# Patient Record
Sex: Male | Born: 1990 | Race: White | Hispanic: No | Marital: Married | Smoking: Current every day smoker
Health system: Southern US, Community
[De-identification: ages and names within clinical notes are randomized; demographics above are authoritative.]

## PROBLEM LIST (undated history)

## (undated) DIAGNOSIS — I480 Paroxysmal atrial fibrillation: Secondary | ICD-10-CM

## (undated) DIAGNOSIS — G4730 Sleep apnea, unspecified: Secondary | ICD-10-CM

## (undated) HISTORY — DX: Paroxysmal atrial fibrillation: I48.0

## (undated) HISTORY — DX: Sleep apnea, unspecified: G47.30

---
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
## 2011-02-18 ENCOUNTER — Emergency Department: Payer: Self-pay | Admitting: Unknown Physician Specialty

## 2011-06-08 ENCOUNTER — Inpatient Hospital Stay: Payer: Self-pay | Admitting: Internal Medicine

## 2011-07-16 ENCOUNTER — Emergency Department: Payer: Self-pay | Admitting: Emergency Medicine

## 2011-07-17 ENCOUNTER — Emergency Department: Payer: Self-pay | Admitting: Emergency Medicine

## 2012-07-16 ENCOUNTER — Emergency Department: Payer: Self-pay | Admitting: Internal Medicine

## 2013-04-11 ENCOUNTER — Emergency Department: Payer: Self-pay | Admitting: Emergency Medicine

## 2014-01-16 ENCOUNTER — Emergency Department: Payer: Self-pay | Admitting: Emergency Medicine

## 2014-01-16 LAB — CBC
HCT: 44 % (ref 40.0–52.0)
HGB: 15.1 g/dL (ref 13.0–18.0)
MCH: 30 pg (ref 26.0–34.0)
MCHC: 34.2 g/dL (ref 32.0–36.0)
MCV: 88 fL (ref 80–100)
Platelet: 149 10*3/uL — ABNORMAL LOW (ref 150–440)
RBC: 5.03 10*6/uL (ref 4.40–5.90)
RDW: 13.4 % (ref 11.5–14.5)
WBC: 6.7 10*3/uL (ref 3.8–10.6)

## 2014-01-16 LAB — COMPREHENSIVE METABOLIC PANEL
ALK PHOS: 66 U/L
ANION GAP: 1 — AB (ref 7–16)
AST: 20 U/L (ref 15–37)
Albumin: 4 g/dL (ref 3.4–5.0)
BUN: 12 mg/dL (ref 7–18)
Bilirubin,Total: 0.4 mg/dL (ref 0.2–1.0)
CHLORIDE: 104 mmol/L (ref 98–107)
CREATININE: 0.84 mg/dL (ref 0.60–1.30)
Calcium, Total: 9.1 mg/dL (ref 8.5–10.1)
Co2: 31 mmol/L (ref 21–32)
EGFR (African American): >60
EGFR (Non-African Amer.): >60
Glucose: 94 mg/dL (ref 65–99)
OSMOLALITY: 271 (ref 275–301)
POTASSIUM: 4.1 mmol/L (ref 3.5–5.1)
SGPT (ALT): 25 U/L (ref 12–78)
Sodium: 136 mmol/L (ref 136–145)
Total Protein: 7.6 g/dL (ref 6.4–8.2)

## 2014-01-16 LAB — URINALYSIS, COMPLETE
BILIRUBIN, UR: NEGATIVE
Bacteria: NONE SEEN
Blood: NEGATIVE
Glucose,UR: NEGATIVE mg/dL (ref 0–75)
KETONE: NEGATIVE
Leukocyte Esterase: NEGATIVE
NITRITE: NEGATIVE
Ph: 6 (ref 4.5–8.0)
Protein: NEGATIVE
RBC,UR: NONE SEEN /HPF (ref 0–5)
SPECIFIC GRAVITY: 1.014 (ref 1.003–1.030)
Squamous Epithelial: NONE SEEN
WBC UR: <1 /HPF (ref 0–5)

## 2014-01-16 LAB — LIPASE, BLOOD: LIPASE: 108 U/L (ref 73–393)

## 2014-05-11 ENCOUNTER — Encounter (INDEPENDENT_AMBULATORY_CARE_PROVIDER_SITE_OTHER): Payer: Self-pay

## 2014-05-11 ENCOUNTER — Ambulatory Visit (INDEPENDENT_AMBULATORY_CARE_PROVIDER_SITE_OTHER): Payer: 59 | Admitting: Cardiovascular Disease

## 2014-05-11 ENCOUNTER — Encounter: Payer: Self-pay | Admitting: Cardiovascular Disease

## 2014-05-11 VITALS — BP 127/74 | HR 72 | Ht 67.0 in | Wt 149.2 lb

## 2014-05-11 DIAGNOSIS — I499 Cardiac arrhythmia, unspecified: Secondary | ICD-10-CM

## 2014-05-11 DIAGNOSIS — I4891 Unspecified atrial fibrillation: Secondary | ICD-10-CM

## 2014-05-11 DIAGNOSIS — I480 Paroxysmal atrial fibrillation: Secondary | ICD-10-CM

## 2014-05-11 NOTE — Assessment & Plan Note (Signed)
The patient had one documented episode of atrial fibrillation in 2012. No further episodes since then. Thus, I recommend watchful waiting. No treatment is indicated at the present time. I strongly advised him to decrease caffeine intake.

## 2014-05-11 NOTE — Progress Notes (Signed)
   HPI  This is a 23 year old male who is self-referred to establish cardiovascular care. He was hospitalized in 2012 at [facility] for atrial fibrillation with rapid ventricular response. Echocardiogram was unremarkable. He took the medication for a few months after that. He reports no further episodes of atrial fibrillation or tachycardia since that time. He did have some palpitations last year but they were brief. He consumes excessive amount of caffeine in the form of 6-7 bottles of [facility] everyday. He denies excessive alcohol use. He denies any recreational drug use. No chest pain no shortness of breath. He does not smoke. There is family history of coronary artery disease but not prematurely. There is no family history of atrial fibrillation or sudden death.  No Known Allergies   No current outpatient prescriptions on file prior to visit.   No current facility-administered medications on file prior to visit.     Past Medical History  Diagnosis Date  . Sleep apnea   . Paroxysmal atrial fibrillation      History reviewed. No pertinent past surgical history.   Family History  Problem Relation Age of Onset  . Family history unknown: Yes     History   Social History  . Marital Status: Single    Spouse Name: N/A    Number of Children: N/A  . Years of Education: N/A   Occupational History  . Not on file.   Social History Main Topics  . Smoking status: Former Smoker -- 2 years    Types: Cigarettes  . Smokeless tobacco: Not on file  . Alcohol Use: No  . Drug Use: No  . Sexual Activity: Not on file   Other Topics Concern  . Not on file   Social History Narrative  . No narrative on file     ROS A 10 point review of system was performed. It is negative other than that mentioned in the history of present illness.   PHYSICAL EXAM   BP 127/74  Pulse 72  Ht 5\' 7"  (1.702 m)  Wt 149 lb 4 oz (67.699 kg)  BMI 23.37 kg/m2 Constitutional: He is oriented to person,  place, and time. He appears well-developed and well-nourished. No distress.  HENT: No nasal discharge.  Head: Normocephalic and atraumatic.  Eyes: Pupils are equal and round.  No discharge. Neck: Normal range of motion. Neck supple. No JVD present. No thyromegaly present.  Cardiovascular: Normal rate, regular rhythm, normal heart sounds. Exam reveals no gallop and no friction rub. No murmur heard.  Pulmonary/Chest: Effort normal and breath sounds normal. No stridor. No respiratory distress. He has no wheezes. He has no rales. He exhibits no tenderness.  Abdominal: Soft. Bowel sounds are normal. He exhibits no distension. There is no tenderness. There is no rebound and no guarding.  Musculoskeletal: Normal range of motion. He exhibits no edema and no tenderness.  Neurological: He is alert and oriented to person, place, and time. Coordination normal.  Skin: Skin is warm and dry. No rash noted. He is not diaphoretic. No erythema. No pallor.  Psychiatric: He has a normal mood and affect. His behavior is normal. Judgment and thought content normal.       EKG: Sinus  Rhythm  WITHIN NORMAL LIMITS   ASSESSMENT AND PLAN

## 2014-05-11 NOTE — Patient Instructions (Signed)
Decrease the amount of caffeine   Your physician wants you to follow-up in: 6 months.  You will receive a reminder letter in the mail two months in advance. If you don't receive a letter, please call our office to schedule the follow-up appointment.

## 2014-05-14 ENCOUNTER — Emergency Department: Payer: Self-pay | Admitting: Emergency Medicine

## 2014-05-14 ENCOUNTER — Telehealth: Payer: Self-pay

## 2014-05-14 LAB — CBC
HCT: 45 % (ref 40.0–52.0)
HGB: 15.1 g/dL (ref 13.0–18.0)
MCH: 29.6 pg (ref 26.0–34.0)
MCHC: 33.6 g/dL (ref 32.0–36.0)
MCV: 88 fL (ref 80–100)
Platelet: 167 10*3/uL (ref 150–440)
RBC: 5.12 10*6/uL (ref 4.40–5.90)
RDW: 13.1 % (ref 11.5–14.5)
WBC: 7.7 10*3/uL (ref 3.8–10.6)

## 2014-05-14 LAB — COMPREHENSIVE METABOLIC PANEL
ALK PHOS: 66 U/L
ALT: 21 U/L (ref 12–78)
Albumin: 4.3 g/dL (ref 3.4–5.0)
Anion Gap: 3 — ABNORMAL LOW (ref 7–16)
BUN: 13 mg/dL (ref 7–18)
Bilirubin,Total: 0.2 mg/dL (ref 0.2–1.0)
CHLORIDE: 102 mmol/L (ref 98–107)
Calcium, Total: 9.1 mg/dL (ref 8.5–10.1)
Co2: 34 mmol/L — ABNORMAL HIGH (ref 21–32)
Creatinine: 0.95 mg/dL (ref 0.60–1.30)
EGFR (African American): >60
EGFR (Non-African Amer.): >60
Glucose: 105 mg/dL — ABNORMAL HIGH (ref 65–99)
Osmolality: 278 (ref 275–301)
POTASSIUM: 3.7 mmol/L (ref 3.5–5.1)
SGOT(AST): 14 U/L — ABNORMAL LOW (ref 15–37)
SODIUM: 139 mmol/L (ref 136–145)
Total Protein: 8.2 g/dL (ref 6.4–8.2)

## 2014-05-14 LAB — TROPONIN I: Troponin-I: <0.02 ng/mL

## 2014-05-14 LAB — PRO B NATRIURETIC PEPTIDE: B-TYPE NATIURETIC PEPTID: 18 pg/mL (ref 0–125)

## 2014-05-14 NOTE — Telephone Encounter (Signed)
Patient returned call and stated he was told in the ED that his chest pain was being caused by acid reflux  He does not wish to be seen at this time

## 2014-05-14 NOTE — Telephone Encounter (Signed)
Pt called and states he is having chest pain currently at work.

## 2014-05-14 NOTE — Telephone Encounter (Signed)
LVM to follow up w patient 5/18

## 2014-05-14 NOTE — Telephone Encounter (Signed)
Patient called back and stated EMS came to his work site and did EKG  Patient said EMS told him that "if he wanted more complete answers he would need blood work" I discussed coming in for a visit with the patient  He stated he would [facility]

## 2014-05-14 NOTE — Telephone Encounter (Signed)
Spoke w/ pt.  He reports that he is having intermittent chest pain, dull, then sharp, on the left side of his chest under his breast. Denies any nausea or sweating, though states that he did "almost throw up about 30 mins ago". Describes the pain as 5/10 on pain scale. States that he previously had similar sx "a long time ago", saw Dr. Juliann Paresallwood, and was diagnosed w/ irregular heartbeat. Admits that he had a small amount of caffeine this morning, "a swig of my friend's drink, not even a whole mouthful". Asked pt what HR is, he states that he does not now how to obtain this.  Pt did not count the number, but states that it is irregular and "jumping around". Advised pt to come to the office for EKG, but he states that he cannot get off of work and does not have copay.  Advised pt not to drive if he is having chest pain.  Pt reports that he is currently at work at [employer] in [place], [place]. Advised pt that he can call EMS or 911 and they can come out and perform and EKG on him and seek treatment if appropriate.  He is agreeable to this, states that he will talk to his boss and "make sure that's okay" and will call w/ further questions or concerns.

## 2014-07-15 ENCOUNTER — Emergency Department: Payer: Self-pay | Admitting: Emergency Medicine

## 2014-07-15 LAB — COMPREHENSIVE METABOLIC PANEL
ALK PHOS: 82 U/L
Albumin: 4.1 g/dL (ref 3.4–5.0)
Anion Gap: 3 — ABNORMAL LOW (ref 7–16)
BILIRUBIN TOTAL: 0.3 mg/dL (ref 0.2–1.0)
BUN: 12 mg/dL (ref 7–18)
CHLORIDE: 106 mmol/L (ref 98–107)
Calcium, Total: 9.1 mg/dL (ref 8.5–10.1)
Co2: 32 mmol/L (ref 21–32)
Creatinine: 1.13 mg/dL (ref 0.60–1.30)
EGFR (Non-African Amer.): >60
Glucose: 91 mg/dL (ref 65–99)
Osmolality: 281 (ref 275–301)
Potassium: 4 mmol/L (ref 3.5–5.1)
SGOT(AST): 22 U/L (ref 15–37)
SGPT (ALT): 19 U/L (ref 12–78)
Sodium: 141 mmol/L (ref 136–145)
Total Protein: 8.2 g/dL (ref 6.4–8.2)

## 2014-07-15 LAB — CBC WITH DIFFERENTIAL/PLATELET
Basophil #: 0.1 10*3/uL (ref 0.0–0.1)
Basophil %: 0.6 %
Eosinophil #: 0.2 10*3/uL (ref 0.0–0.7)
Eosinophil %: 2.4 %
HCT: 44.6 % (ref 40.0–52.0)
HGB: 15.1 g/dL (ref 13.0–18.0)
Lymphocyte #: 2 10*3/uL (ref 1.0–3.6)
Lymphocyte %: 21.2 %
MCH: 30.2 pg (ref 26.0–34.0)
MCHC: 33.9 g/dL (ref 32.0–36.0)
MCV: 89 fL (ref 80–100)
Monocyte #: 0.7 x10 3/mm (ref 0.2–1.0)
Monocyte %: 7.9 %
NEUTROS PCT: 67.9 %
Neutrophil #: 6.4 10*3/uL (ref 1.4–6.5)
Platelet: 167 10*3/uL (ref 150–440)
RBC: 5.01 10*6/uL (ref 4.40–5.90)
RDW: 13.5 % (ref 11.5–14.5)
WBC: 9.4 10*3/uL (ref 3.8–10.6)

## 2014-07-15 LAB — URINALYSIS, COMPLETE
BACTERIA: NONE SEEN
BLOOD: NEGATIVE
Bilirubin,UR: NEGATIVE
Glucose,UR: NEGATIVE mg/dL (ref 0–75)
Ketone: NEGATIVE
Leukocyte Esterase: NEGATIVE
NITRITE: NEGATIVE
Ph: 6 (ref 4.5–8.0)
Protein: NEGATIVE
RBC,UR: 2 /HPF (ref 0–5)
SPECIFIC GRAVITY: 1.027 (ref 1.003–1.030)
Squamous Epithelial: NONE SEEN
WBC UR: 1 /HPF (ref 0–5)

## 2016-11-09 ENCOUNTER — Emergency Department: Payer: BLUE CROSS/BLUE SHIELD

## 2016-11-09 ENCOUNTER — Emergency Department
Admission: EM | Admit: 2016-11-09 | Discharge: 2016-11-09 | Disposition: A | Discharge status: Home / Self Care | Payer: BLUE CROSS/BLUE SHIELD | Attending: Emergency Medicine | Admitting: Emergency Medicine

## 2016-11-09 ENCOUNTER — Encounter: Payer: Self-pay | Admitting: Emergency Medicine

## 2016-11-09 DIAGNOSIS — Z87891 Personal history of nicotine dependence: Secondary | ICD-10-CM | POA: Diagnosis not present

## 2016-11-09 DIAGNOSIS — R072 Precordial pain: Secondary | ICD-10-CM | POA: Insufficient documentation

## 2016-11-09 DIAGNOSIS — R079 Chest pain, unspecified: Secondary | ICD-10-CM

## 2016-11-09 LAB — BASIC METABOLIC PANEL
ANION GAP: 7 (ref 5–15)
BUN: 10 mg/dL (ref 6–20)
CALCIUM: 9.5 mg/dL (ref 8.9–10.3)
CO2: 29 mmol/L (ref 22–32)
Chloride: 102 mmol/L (ref 101–111)
Creatinine, Ser: 0.97 mg/dL (ref 0.61–1.24)
GFR calc Af Amer: >60 mL/min (ref >60)
GLUCOSE: 102 mg/dL — AB (ref 65–99)
POTASSIUM: 3.7 mmol/L (ref 3.5–5.1)
Sodium: 138 mmol/L (ref 135–145)

## 2016-11-09 LAB — CBC
HEMATOCRIT: 45.9 % (ref 40.0–52.0)
HEMOGLOBIN: 16 g/dL (ref 13.0–18.0)
MCH: 30.3 pg (ref 26.0–34.0)
MCHC: 34.8 g/dL (ref 32.0–36.0)
MCV: 87.1 fL (ref 80.0–100.0)
Platelets: 186 10*3/uL (ref 150–440)
RBC: 5.28 MIL/uL (ref 4.40–5.90)
RDW: 12.9 % (ref 11.5–14.5)
WBC: 9.1 10*3/uL (ref 3.8–10.6)

## 2016-11-09 LAB — TROPONIN I: Troponin I: <0.03 ng/mL (ref <0.03)

## 2016-11-09 MED ORDER — RANITIDINE HCL 150 MG PO TABS: 150.0000 mg | ORAL_TABLET | Freq: Two times a day (BID) | ORAL | 0 refills | Status: DC

## 2016-11-09 NOTE — ED Triage Notes (Signed)
Pt reports midsternal CP, onset hr PTA, nonradiating accomp by nausea; st hx of same and dx with reflux

## 2016-11-09 NOTE — ED Provider Notes (Signed)
[facility] Emergency Department Provider Note  ____________________________________________   First MD Initiated Contact with Patient 11/09/16 2016     (approximate)  I have reviewed the triage vital signs and the nursing notes.   HISTORY  Chief Complaint Chest Pain   HPI Joe [name] is a 25 y.o. male with a history of atrial fibrillation was presenting to the emergency department today with sudden onset chest pain that started at about 5:00. He said that he was sitting at a table at home when he had a sudden onset of squeezing chest pain that was a 10 out of 10. He denied any radiation. He denied any shortness of breath. He said that he did have worsening of his pain with deep breathing. However, after about an hour his pain completely resolved. He has no shortness of breath at this time. Denies any pain. Denies any nausea or vomiting. York SpanielSaid that he has similar episode about 7 years ago that was diagnosed with reflux. He denies taking any medications at this time. Denies smoking. Denies any history of blood clots in him or his family. He said that he used to be on a medication for his reflux, Zantac but is no longer taking this.   Past Medical History:  Diagnosis Date  . Paroxysmal atrial fibrillation (HCC)   . Sleep apnea     Patient Active Problem List   Diagnosis Date Noted  . Paroxysmal atrial fibrillation (HCC)     History reviewed. No pertinent surgical history.  Prior to Admission medications   Not on File    Allergies Patient has no known allergies.  Family History  Problem Relation Age of Onset  . Family history unknown: Yes    Social History Social History  Substance Use Topics  . Smoking status: Former Smoker    Years: 2.00    Types: Cigarettes  . Smokeless tobacco: Never Used  . Alcohol use No    Review of Systems Constitutional: No fever/chills Eyes: No visual changes. ENT: No sore throat. Cardiovascular: As  above Respiratory: As above Gastrointestinal: No abdominal pain.  No nausea, no vomiting.  No diarrhea.  No constipation. Genitourinary: Negative for dysuria. Musculoskeletal: Negative for back pain. Skin: Negative for rash. Neurological: Negative for headaches, focal weakness or numbness.  10-point ROS otherwise negative.  ____________________________________________   PHYSICAL EXAM:  VITAL SIGNS: ED Triage Vitals  Enc Vitals Group     BP 11/09/16 1904 122/75     Pulse Rate 11/09/16 1904 98     Resp 11/09/16 1904 (!) 22     Temp 11/09/16 1904 97.6 F (36.4 C)     Temp Source 11/09/16 1904 Oral     SpO2 11/09/16 1904 100 %     Weight 11/09/16 1901 135 lb (61.2 kg)     Height 11/09/16 1901 5\' 6"  (1.676 m)     Head Circumference --      Peak Flow --      Pain Score 11/09/16 1902 10     Pain Loc --      Pain Edu? --      Excl. in GC? --     Constitutional: Alert and oriented. Well appearing and in no acute distress. Eyes: Conjunctivae are normal. PERRL. EOMI. Head: Atraumatic. Nose: No congestion/rhinnorhea. Mouth/Throat: Mucous membranes are moist.   Neck: No stridor.   Cardiovascular: Normal rate, regular rhythm. Grossly normal heart sounds.  Pain is not reproducible palpation. Respiratory: Normal respiratory effort.  No retractions.  Lungs CTAB. Respiratory rate of 18 in the room. Gastrointestinal: Soft and nontender. No distention. No CVA tenderness. Musculoskeletal: No lower extremity tenderness nor edema.  No joint effusions. Neurologic:  Normal speech and language. No gross focal neurologic deficits are appreciated. No gait instability. Skin:  Skin is warm, dry and intact. No rash noted. Psychiatric: Mood and affect are normal. Speech and behavior are normal.  ____________________________________________   LABS (all labs ordered are listed, but only abnormal results are displayed)  Labs Reviewed  BASIC METABOLIC PANEL - Abnormal; Notable for the following:         Result Value   Glucose, Bld 102 (*)    All other components within normal limits  CBC  TROPONIN I   ____________________________________________  EKG  ED ECG REPORT I, Arelia LongestSchaevitz,  Halil Rentz M, the attending physician, personally viewed and interpreted this ECG.   Date: 11/09/2016  EKG Time: 1901  Rate: 109  Rhythm: normal sinus rhythm  Axis: Normal  Intervals:none  ST&T Change: No ST segment elevation or depression. No abnormal T-wave inversion.  ____________________________________________  RADIOLOGY  DG Chest 2 View (Final result)  Result time 11/09/16 19:25:24  Final result by Awilda Metroourtnay Bloomer, MD (11/09/16 19:25:24)           Narrative:   CLINICAL DATA: Midsternal chest pain. History of reflux disease.  EXAM: CHEST 2 VIEW  COMPARISON: Chest radiograph May 14, 2014  FINDINGS: Cardiomediastinal silhouette is normal. No pleural effusions or focal consolidations. Trachea projects midline and there is no pneumothorax. Soft tissue planes and included osseous structures are non-suspicious.  IMPRESSION: Normal chest.   Electronically Signed By: Awilda Metroourtnay Bloomer M.D. On: 11/09/2016 19:25          ____________________________________________   PROCEDURES  Procedure(s) performed:   Procedures  Critical Care performed:   ____________________________________________   INITIAL IMPRESSION / ASSESSMENT AND PLAN / ED COURSE  Pertinent labs & imaging results that were available during my care of the patient were reviewed by me and considered in my medical decision making (see chart for details).  Clinical Course    ----------------------------------------- 9:15 PM on 11/09/2016 -----------------------------------------  Patient asymptomatic at this time. Very reassuring workup. Was initially tachycardic but while I was examining him in the room his heart rate was in the 70s and 80s. Major consideration for this visit would be a pulmonary  embolus. However, this is highly unlikely given the spontaneous resolution of the symptoms. The patient also reported that he has been passing a lot of gas and the symptoms have improved gradually over time with this. We discussed further workup and agreed that the patient would be restarted on Zantac and that if he had any return of his symptoms he would return to the emergency department for further workup at this time.  ____________________________________________   FINAL CLINICAL IMPRESSION(S) / ED DIAGNOSES  Chest pain.  NEW MEDICATIONS STARTED DURING THIS VISIT:  New Prescriptions   No medications on file     Note:  This document was prepared using Dragon voice recognition software and may include unintentional dictation errors.    Myrna Blazeravid Matthew Adolph Clutter, MD 11/09/16 2119

## 2016-11-09 NOTE — ED Notes (Signed)
Discharge instructions reviewed with patient. Patient verbalized understanding. Patient ambulated to lobby without difficulty.   

## 2017-04-29 ENCOUNTER — Emergency Department (HOSPITAL_COMMUNITY): Payer: Worker's Compensation | Admitting: Certified Registered Nurse Anesthetist

## 2017-04-29 ENCOUNTER — Ambulatory Visit (HOSPITAL_COMMUNITY)
Admission: EM | Admit: 2017-04-29 | Discharge: 2017-04-29 | Disposition: A | Discharge status: Home / Self Care | Payer: Worker's Compensation | Attending: Emergency Medicine | Admitting: Emergency Medicine

## 2017-04-29 ENCOUNTER — Encounter (HOSPITAL_COMMUNITY): Payer: Self-pay

## 2017-04-29 ENCOUNTER — Encounter (HOSPITAL_COMMUNITY)
Admission: EM | Disposition: A | Discharge status: Home / Self Care | Payer: Self-pay | Source: Home / Self Care | Attending: Emergency Medicine

## 2017-04-29 ENCOUNTER — Emergency Department (HOSPITAL_COMMUNITY): Payer: Worker's Compensation

## 2017-04-29 DIAGNOSIS — Z79899 Other long term (current) drug therapy: Secondary | ICD-10-CM | POA: Diagnosis not present

## 2017-04-29 DIAGNOSIS — M795 Residual foreign body in soft tissue: Secondary | ICD-10-CM | POA: Diagnosis present

## 2017-04-29 DIAGNOSIS — G4730 Sleep apnea, unspecified: Secondary | ICD-10-CM | POA: Insufficient documentation

## 2017-04-29 DIAGNOSIS — I480 Paroxysmal atrial fibrillation: Secondary | ICD-10-CM | POA: Insufficient documentation

## 2017-04-29 DIAGNOSIS — S61442A Puncture wound with foreign body of left hand, initial encounter: Secondary | ICD-10-CM | POA: Insufficient documentation

## 2017-04-29 DIAGNOSIS — Z87891 Personal history of nicotine dependence: Secondary | ICD-10-CM | POA: Insufficient documentation

## 2017-04-29 DIAGNOSIS — X58XXXA Exposure to other specified factors, initial encounter: Secondary | ICD-10-CM | POA: Diagnosis not present

## 2017-04-29 HISTORY — PX: FOREIGN BODY REMOVAL: SHX962

## 2017-04-29 LAB — BASIC METABOLIC PANEL
Anion gap: 5 (ref 5–15)
BUN: 9 mg/dL (ref 6–20)
CO2: 27 mmol/L (ref 22–32)
Calcium: 8.9 mg/dL (ref 8.9–10.3)
Chloride: 106 mmol/L (ref 101–111)
Creatinine, Ser: 0.97 mg/dL (ref 0.61–1.24)
GFR calc Af Amer: >60 mL/min (ref >60)
GFR calc non Af Amer: >60 mL/min (ref >60)
Glucose, Bld: 91 mg/dL (ref 65–99)
Potassium: 3.7 mmol/L (ref 3.5–5.1)
Sodium: 138 mmol/L (ref 135–145)

## 2017-04-29 LAB — CBC WITH DIFFERENTIAL/PLATELET
Basophils Absolute: 0 10*3/uL (ref 0.0–0.1)
Basophils Relative: 0 %
Eosinophils Absolute: 0.1 10*3/uL (ref 0.0–0.7)
Eosinophils Relative: 0 %
HCT: 41.5 % (ref 39.0–52.0)
Hemoglobin: 14.2 g/dL (ref 13.0–17.0)
Lymphocytes Relative: 18 %
Lymphs Abs: 2.2 10*3/uL (ref 0.7–4.0)
MCH: 29.6 pg (ref 26.0–34.0)
MCHC: 34.2 g/dL (ref 30.0–36.0)
MCV: 86.6 fL (ref 78.0–100.0)
Monocytes Absolute: 1 10*3/uL (ref 0.1–1.0)
Monocytes Relative: 8 %
Neutro Abs: 9.1 10*3/uL — ABNORMAL HIGH (ref 1.7–7.7)
Neutrophils Relative %: 74 %
Platelets: 197 10*3/uL (ref 150–400)
RBC: 4.79 MIL/uL (ref 4.22–5.81)
RDW: 12.7 % (ref 11.5–15.5)
WBC: 12.3 10*3/uL — ABNORMAL HIGH (ref 4.0–10.5)

## 2017-04-29 SURGERY — REMOVAL FOREIGN BODY EXTREMITY
Anesthesia: General | Site: Hand | Laterality: Left

## 2017-04-29 MED ORDER — MIDAZOLAM HCL 2 MG/2ML IJ SOLN
INTRAMUSCULAR | Status: DC | PRN
  Administered 2017-04-29: 2 mg via INTRAVENOUS

## 2017-04-29 MED ORDER — MIDAZOLAM HCL 2 MG/2ML IJ SOLN: 0.5000 mg | Freq: Once | INTRAMUSCULAR | Status: DC | PRN

## 2017-04-29 MED ORDER — MORPHINE SULFATE (PF) 4 MG/ML IV SOLN
4.0000 mg | Freq: Once | INTRAVENOUS | Status: AC
  Administered 2017-04-29: 4 mg via INTRAVENOUS
  Filled 2017-04-29: qty 1

## 2017-04-29 MED ORDER — MIDAZOLAM HCL 2 MG/2ML IJ SOLN
INTRAMUSCULAR | Status: AC
  Filled 2017-04-29: qty 2

## 2017-04-29 MED ORDER — HYDROMORPHONE HCL 1 MG/ML IJ SOLN: 0.2500 mg | INTRAMUSCULAR | Status: DC | PRN

## 2017-04-29 MED ORDER — CHLORHEXIDINE GLUCONATE 4 % EX LIQD: 60.0000 mL | Freq: Once | CUTANEOUS | Status: DC

## 2017-04-29 MED ORDER — PROMETHAZINE HCL 25 MG/ML IJ SOLN: 6.2500 mg | INTRAMUSCULAR | Status: DC | PRN

## 2017-04-29 MED ORDER — LACTATED RINGERS IV SOLN
INTRAVENOUS | Status: DC
  Administered 2017-04-29: 18:00:00 via INTRAVENOUS

## 2017-04-29 MED ORDER — FENTANYL CITRATE (PF) 250 MCG/5ML IJ SOLN
INTRAMUSCULAR | Status: AC
  Filled 2017-04-29: qty 5

## 2017-04-29 MED ORDER — LIDOCAINE 2% (20 MG/ML) 5 ML SYRINGE
INTRAMUSCULAR | Status: DC | PRN
Start: 2017-04-29 — End: 2017-04-29
  Administered 2017-04-29: 50 mg via INTRAVENOUS

## 2017-04-29 MED ORDER — ONDANSETRON HCL 4 MG/2ML IJ SOLN
INTRAMUSCULAR | Status: DC | PRN
  Administered 2017-04-29: 4 mg via INTRAVENOUS

## 2017-04-29 MED ORDER — SODIUM CHLORIDE 0.9 % IV BOLUS (SEPSIS)
1000.0000 mL | Freq: Once | INTRAVENOUS | Status: AC
  Administered 2017-04-29: 1000 mL via INTRAVENOUS

## 2017-04-29 MED ORDER — LIDOCAINE 2% (20 MG/ML) 5 ML SYRINGE
INTRAMUSCULAR | Status: AC
  Filled 2017-04-29: qty 5

## 2017-04-29 MED ORDER — 0.9 % SODIUM CHLORIDE (POUR BTL) OPTIME
TOPICAL | Status: DC | PRN
  Administered 2017-04-29: 1000 mL

## 2017-04-29 MED ORDER — SUCCINYLCHOLINE CHLORIDE 200 MG/10ML IV SOSY
PREFILLED_SYRINGE | INTRAVENOUS | Status: AC
  Filled 2017-04-29: qty 10

## 2017-04-29 MED ORDER — CEFAZOLIN SODIUM-DEXTROSE 2-4 GM/100ML-% IV SOLN
2.0000 g | INTRAVENOUS | Status: DC
  Filled 2017-04-29: qty 100

## 2017-04-29 MED ORDER — SUCCINYLCHOLINE CHLORIDE 200 MG/10ML IV SOSY
PREFILLED_SYRINGE | INTRAVENOUS | Status: DC | PRN
  Administered 2017-04-29: 50 mg via INTRAVENOUS

## 2017-04-29 MED ORDER — BUPIVACAINE HCL (PF) 0.25 % IJ SOLN
INTRAMUSCULAR | Status: AC
  Filled 2017-04-29: qty 30

## 2017-04-29 MED ORDER — SULFAMETHOXAZOLE-TRIMETHOPRIM 800-160 MG PO TABS: 1.0000 | ORAL_TABLET | Freq: Two times a day (BID) | ORAL | 0 refills | Status: DC

## 2017-04-29 MED ORDER — BUPIVACAINE HCL (PF) 0.25 % IJ SOLN
INTRAMUSCULAR | Status: DC | PRN
  Administered 2017-04-29: 8 mL

## 2017-04-29 MED ORDER — CEFAZOLIN SODIUM-DEXTROSE 2-3 GM-% IV SOLR
INTRAVENOUS | Status: DC | PRN
  Administered 2017-04-29: 2 g via INTRAVENOUS

## 2017-04-29 MED ORDER — LIDOCAINE HCL 1 % IJ SOLN
INTRAMUSCULAR | Status: AC
  Filled 2017-04-29: qty 20

## 2017-04-29 MED ORDER — FENTANYL CITRATE (PF) 100 MCG/2ML IJ SOLN
INTRAMUSCULAR | Status: DC | PRN
  Administered 2017-04-29: 100 ug via INTRAVENOUS
  Administered 2017-04-29: 150 ug via INTRAVENOUS

## 2017-04-29 MED ORDER — HYDROCODONE-ACETAMINOPHEN 5-325 MG PO TABS: ORAL_TABLET | ORAL | 0 refills | Status: DC

## 2017-04-29 MED ORDER — LACTATED RINGERS IV SOLN
INTRAVENOUS | Status: DC | PRN
  Administered 2017-04-29: 18:00:00 via INTRAVENOUS

## 2017-04-29 MED ORDER — MEPERIDINE HCL 25 MG/ML IJ SOLN: 6.2500 mg | INTRAMUSCULAR | Status: DC | PRN

## 2017-04-29 MED ORDER — POVIDONE-IODINE 10 % EX SWAB: 2.0000 "application " | Freq: Once | CUTANEOUS | Status: DC

## 2017-04-29 MED ORDER — PROPOFOL 10 MG/ML IV BOLUS
INTRAVENOUS | Status: DC | PRN
  Administered 2017-04-29: 40 mg via INTRAVENOUS
  Administered 2017-04-29: 160 mg via INTRAVENOUS

## 2017-04-29 MED ORDER — ONDANSETRON HCL 4 MG/2ML IJ SOLN
INTRAMUSCULAR | Status: AC
  Filled 2017-04-29: qty 2

## 2017-04-29 SURGICAL SUPPLY — 57 items
BAG DECANTER FOR FLEXI CONT (MISCELLANEOUS) IMPLANT
BANDAGE ACE 3X5.8 VEL STRL LF (GAUZE/BANDAGES/DRESSINGS) IMPLANT
BANDAGE ACE 4X5 VEL STRL LF (GAUZE/BANDAGES/DRESSINGS) ×3 IMPLANT
BANDAGE COBAN STERILE 2 (GAUZE/BANDAGES/DRESSINGS) ×3 IMPLANT
BLADE MINI RND TIP GREEN BEAV (BLADE) IMPLANT
BLADE SURG 15 STRL LF DISP TIS (BLADE) IMPLANT
BLADE SURG 15 STRL SS (BLADE)
BNDG ESMARK 4X9 LF (GAUZE/BANDAGES/DRESSINGS) ×3 IMPLANT
BNDG GAUZE ELAST 4 BULKY (GAUZE/BANDAGES/DRESSINGS) ×3 IMPLANT
CHLORAPREP W/TINT 26ML (MISCELLANEOUS) IMPLANT
CORDS BIPOLAR (ELECTRODE) ×3 IMPLANT
COVER BACK TABLE 60X90IN (DRAPES) IMPLANT
COVER MAYO STAND STRL (DRAPES) IMPLANT
CUFF TOURNIQUET SINGLE 18IN (TOURNIQUET CUFF) ×3 IMPLANT
DECANTER SPIKE VIAL GLASS SM (MISCELLANEOUS) IMPLANT
DRAPE HALF SHEET 40X57 (DRAPES) IMPLANT
DRAPE SURG 17X23 STRL (DRAPES) ×3 IMPLANT
GAUZE SPONGE 4X4 12PLY STRL (GAUZE/BANDAGES/DRESSINGS) ×3 IMPLANT
GAUZE XEROFORM 1X8 LF (GAUZE/BANDAGES/DRESSINGS) ×3 IMPLANT
GLOVE BIO SURGEON STRL SZ7.5 (GLOVE) ×3 IMPLANT
GLOVE BIOGEL PI IND STRL 8 (GLOVE) ×1 IMPLANT
GLOVE BIOGEL PI INDICATOR 8 (GLOVE) ×2
GOWN STRL REUS W/ TWL LRG LVL3 (GOWN DISPOSABLE) ×1 IMPLANT
GOWN STRL REUS W/ TWL XL LVL3 (GOWN DISPOSABLE) ×1 IMPLANT
GOWN STRL REUS W/TWL LRG LVL3 (GOWN DISPOSABLE) ×2
GOWN STRL REUS W/TWL XL LVL3 (GOWN DISPOSABLE) ×2
KIT BASIN OR (CUSTOM PROCEDURE TRAY) ×3 IMPLANT
LOOP VESSEL MAXI BLUE (MISCELLANEOUS) ×3 IMPLANT
NDL SAFETY ECLIPSE 18X1.5 (NEEDLE) IMPLANT
NEEDLE HYPO 18GX1.5 SHARP (NEEDLE)
NEEDLE HYPO 25X1 1.5 SAFETY (NEEDLE) ×3 IMPLANT
NS IRRIG 1000ML POUR BTL (IV SOLUTION) ×3 IMPLANT
PAD CAST 3X4 CTTN HI CHSV (CAST SUPPLIES) IMPLANT
PAD CAST 4YDX4 CTTN HI CHSV (CAST SUPPLIES) IMPLANT
PADDING CAST ABS 4INX4YD NS (CAST SUPPLIES) ×2
PADDING CAST ABS COTTON 4X4 ST (CAST SUPPLIES) ×1 IMPLANT
PADDING CAST COTTON 3X4 STRL (CAST SUPPLIES)
PADDING CAST COTTON 4X4 STRL (CAST SUPPLIES)
SCRUB BETADINE 4OZ XXX (MISCELLANEOUS) ×3 IMPLANT
SOLUTION BETADINE 4OZ (MISCELLANEOUS) ×3 IMPLANT
SPEAR EYE SURG WECK-CEL (MISCELLANEOUS) IMPLANT
SPLINT PLASTER CAST XFAST 3X15 (CAST SUPPLIES) IMPLANT
SPLINT PLASTER XTRA FASTSET 3X (CAST SUPPLIES)
STOCKINETTE 4X48 STRL (DRAPES) IMPLANT
SUT ETHIBOND 3-0 V-5 (SUTURE) IMPLANT
SUT ETHILON 4 0 PS 2 18 (SUTURE) ×3 IMPLANT
SUT FIBERWIRE 4-0 18 TAPR NDL (SUTURE)
SUT MERSILENE 6 0 P 1 (SUTURE) IMPLANT
SUT NYLON 9 0 VRM6 (SUTURE) IMPLANT
SUT PROLENE 6 0 P 1 18 (SUTURE) IMPLANT
SUT SILK 4 0 PS 2 (SUTURE) IMPLANT
SUT VICRYL 4-0 PS2 18IN ABS (SUTURE) IMPLANT
SUTURE FIBERWR 4-0 18 TAPR NDL (SUTURE) IMPLANT
SYR BULB 3OZ (MISCELLANEOUS) IMPLANT
SYR CONTROL 10ML LL (SYRINGE) ×3 IMPLANT
TOWEL OR 17X24 6PK STRL BLUE (TOWEL DISPOSABLE) ×6 IMPLANT
UNDERPAD 30X30 (UNDERPADS AND DIAPERS) ×3 IMPLANT

## 2017-04-29 NOTE — ED Notes (Signed)
Waiting for hand surgery, pt updated

## 2017-04-29 NOTE — Brief Op Note (Signed)
04/29/2017  7:05 PM  PATIENT:  Joe [name]  26 y.o. male  PRE-OPERATIVE DIAGNOSIS:  left hand foreign body  POST-OPERATIVE DIAGNOSIS:  left hand foreign body  PROCEDURE:  Procedure(s): REMOVAL FOREIGN BODY EXTREMITY and wound exploration (Left)  SURGEON:  Surgeon(s) and Role:    * Betha LoaKevin Keondre Markson, MD - Primary  PHYSICIAN ASSISTANT:   ASSISTANTS: none   ANESTHESIA:   general  EBL:  No intake/output data recorded.  BLOOD ADMINISTERED:none  DRAINS: vessel loop drain  LOCAL MEDICATIONS USED:  MARCAINE     SPECIMEN:  No Specimen  DISPOSITION OF SPECIMEN:  N/A  COUNTS:  YES  TOURNIQUET:   Total Tourniquet Time Documented: Upper Arm (Left) - 21 minutes Total: Upper Arm (Left) - 21 minutes   DICTATION: .Other Dictation: Dictation Number ([id number]) [id number]  PLAN OF CARE: Discharge to home after PACU  PATIENT DISPOSITION:  PACU - hemodynamically stable.

## 2017-04-29 NOTE — Op Note (Signed)
[id number] 

## 2017-04-29 NOTE — Transfer of Care (Signed)
Immediate Anesthesia Transfer of Care Note  Patient: Joe [name]  Procedure(s) Performed: Procedure(s): REMOVAL FOREIGN BODY EXTREMITY and wound exploration (Left)  Patient Location: PACU  Anesthesia Type:General  Level of Consciousness: sedated  Airway & Oxygen Therapy: Patient Spontanous Breathing and Patient connected to nasal cannula oxygen  Post-op Assessment: Report given to RN and Post -op Vital signs reviewed and stable  Post vital signs: Reviewed and stable  Last Vitals:  Vitals:   04/29/17 1645 04/29/17 1700  BP: 115/71 121/74  Pulse: 81 98  Resp:    Temp:      Last Pain:  Vitals:   04/29/17 1726  TempSrc:   PainSc: 3          Complications: No apparent anesthesia complications

## 2017-04-29 NOTE — Progress Notes (Signed)
Patient very anxious after surgery in PACU.  Comforting measures and reassurance offered at bedside.  Patient more calm and relaxed.  Will continue to monitor.

## 2017-04-29 NOTE — ED Provider Notes (Signed)
[facility]-EMERGENCY DEPT Provider Note   CSN: [id number] Arrival date & time: 04/29/17  1342  By signing my name below, I, Rosana Fret, attest that this documentation has been prepared under the direction and in the presence of [employer], PA-C.  Electronically Signed: Rosana Fret, ED Scribe. 04/29/17. 2:31 PM.  History   Chief Complaint Chief Complaint  Patient presents with  . Foreign Body in Skin   The history is provided by the patient. No language interpreter was used.    HPI Comments: Joe [name] is a 26 y.o. male who presents to the Emergency Department complaining of sensation of foreign body to the palmar left hand which began prior to arrival. Per pt, he works in a lumbar yard stacking piles of wood, and today while stacking a pile a small piece of wood was lodged into the palmar surface of his left hand. Pt was previously at urgent care prior to coming into the ED where they numbed the area and attempted to remove the wood. This extraction event was unsuccessful. He notes that with manipulation of the piece of wood that his pain is present more toward the dorsum of his hand. His pain is also exacerbated with this action. He denies numbness, weakness, or any other associated symptoms.  Tetanus was updated at urgent care today.  NPO is 0500.   Past Medical History:  Diagnosis Date  . Paroxysmal atrial fibrillation (HCC)   . Sleep apnea    Patient Active Problem List   Diagnosis Date Noted  . Paroxysmal atrial fibrillation (HCC)    History reviewed. No pertinent surgical history.  Home Medications    Prior to Admission medications   Medication Sig Start Date End Date Taking? Authorizing Provider  ranitidine (ZANTAC) 150 MG tablet Take 1 tablet (150 mg total) by mouth 2 (two) times daily. 11/09/16 11/09/17  Myrna Blazer, MD   Family History Family History  Problem Relation Age of Onset  . Family history unknown: Yes   Social History Social  History  Substance Use Topics  . Smoking status: Former Smoker    Years: 2.00    Types: Cigarettes  . Smokeless tobacco: Never Used  . Alcohol use No   Allergies   Patient has no known allergies.  Review of Systems Review of Systems  Musculoskeletal: Positive for myalgias.  Skin: Positive for wound.  Neurological: Negative for weakness and numbness.  All other systems reviewed and are negative.  Physical Exam Updated Vital Signs BP 120/82 (BP Location: Right Arm)   Pulse 81   Temp 98.6 F (37 C) (Oral)   Resp 17   Ht 5\' 6"  (1.676 m)   Wt 71.2 kg   SpO2 100%   BMI 25.34 kg/m   Physical Exam  Constitutional: He appears well-developed and well-nourished. No distress.  HENT:  Head: Normocephalic and atraumatic.  Eyes: Conjunctivae are normal.  Neck: Normal range of motion.  Cardiovascular: Normal rate.   Pulmonary/Chest: Effort normal.  Abdominal: He exhibits no distension.  Musculoskeletal: He exhibits tenderness.  TTP of the dorsum of the hand b/t the second and third MCPs. See image.   Unable to flex MCP 3rd digit  Neurological: He is alert.  Skin: No pallor.  Psychiatric: He has a normal mood and affect. His behavior is normal.  Nursing note and vitals reviewed.    ED Treatments / Results   DIAGNOSTIC STUDIES: Oxygen Saturation is 100% on RA, normal by my interpretation.   COORDINATION OF CARE: 2:25  PM-Discussed next steps with pt. Pt verbalized understanding and is agreeable with the plan.   Labs (all labs ordered are listed, but only abnormal results are displayed) Labs Reviewed - No data to display  EKG  EKG Interpretation None      Radiology Dg Hand Complete Left  Result Date: 04/29/2017 CLINICAL DATA:  Pt got a shard of wood stuck in left hand today while stacking wood - wood is sticking out of the anterior hand just below the webbing between the 3rd and 4th fingers - pt unwilling to outstretch hand for images due to "pain" EXAM: LEFT HAND  - COMPLETE 3+ VIEW COMPARISON:  None. FINDINGS: No fracture.  No bone lesion. Joints are normally spaced and aligned.  No arthropathic change. The wooden foreign body is not resolved radiographically. There are no radiopaque foreign bodies. IMPRESSION: 1. No fracture, bone lesion or joint abnormality. 2. No foreign body visualized. Electronically Signed   By: Amie Portlandavid  Ormond M.D.   On: 04/29/2017 15:18    Procedures Procedures   Medications Ordered in ED Medications  morphine 4 MG/ML injection 4 mg (4 mg Intravenous Given 04/29/17 1448)  sodium chloride 0.9 % bolus 1,000 mL (1,000 mLs Intravenous New Bag/Given 04/29/17 1556)    Initial Impression / Assessment and Plan / ED Course  I have reviewed the triage vital signs and the nursing notes.  Pertinent labs & imaging results that were available during my care of the patient were reviewed by me and considered in my medical decision making (see chart for details).      Final Clinical Impressions(s) / ED Diagnoses   Final diagnoses:  Foreign body (FB) in soft tissue   New Prescriptions New Prescriptions   No medications on file     Imaging: DG hand complete   Therapeutics: Morphine, normal saline  Discharge Meds:   Assessment/Plan:   26 year old male presents today with embedded foreign object.  Patient has a small piece of wood in the dorsum of his hand.  He was seen at urgent care and had local anesthesia applied.  He reports this improved the site where the piece of wood was placed, but has pain on the dorsum of the hand.  With this dorsal pain, the inability of him to flex his MCP, and the inability of the provider previously to remove the object I am concerned for deep foreign body.  Patient was given pain medication, normal saline.  I spoke with the on-call orthopedic hand surgeon nursing staff, he is in surgery and will touch base as soon as he finishes.  Pt care signed out to oncoming provider pending orthopedic consultation.        Eyvonne MechanicJeffrey Nayeli Calvert, PA-C 04/29/17 1634    Vanetta MuldersScott Zackowski, MD 04/29/17 2059

## 2017-04-29 NOTE — ED Triage Notes (Signed)
Pt presents to ED with a small piece of wood stuck to he palm of his left hand. He was at urgent care and had it numbed but they were unable to remove it because pt could not stand the pain for anyone to touch it 'I dont think they numbed it enough." Scant bleeding.

## 2017-04-29 NOTE — H&P (Signed)
  Joe [name] is an 26 y.o. male.   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  [facility].  [facility].  Reports previous piece of wood in index finger ~ 1 year ago.  No other injuries at this time.  Describes pain in left palm of 10/10 severity.  Alleviated by nothing.  Aggravated by palpation and motion.  Case discussed with Eyvonne MechanicJeffrey Hedges, [facility] and his note from 04/29/2017 reviewed. Xrays viewed and interpreted by me: 3 views left hand show no fractures, dislocations, radioopaque foreign bodies. Labs reviewed: pending  Allergies: No Known Allergies  Past Medical History:  Diagnosis Date  . Paroxysmal atrial fibrillation (HCC)   . Sleep apnea     History reviewed. No pertinent surgical history.  Family History: Family History  Problem Relation Age of Onset  . Family history unknown: Yes    Social History:   reports that he has quit smoking. His smoking use included Cigarettes. He quit after 2.00 years of use. He has never used smokeless tobacco. He reports that he does not drink alcohol or use drugs.  Medications:  (Not in a hospital admission)  No results found for this or any previous visit (from the past 48 hour(s)).  Dg Hand Complete Left  Result Date: 04/29/2017 CLINICAL DATA:  Pt got a shard of wood stuck in left hand today while stacking wood - wood is sticking out of the anterior hand just below the webbing between the 3rd and 4th fingers - pt unwilling to outstretch hand for images due to "pain" EXAM: LEFT HAND - COMPLETE 3+ VIEW COMPARISON:  None. FINDINGS: No fracture.  No bone lesion. Joints are normally spaced and aligned.  No arthropathic change. The wooden foreign body is not resolved radiographically. There are no radiopaque foreign bodies. IMPRESSION: 1. No fracture, bone lesion or joint abnormality.  2. No foreign body visualized. Electronically Signed   By: Amie Portlandavid  Ormond M.D.   On: 04/29/2017 15:18     A comprehensive review of systems was negative. Review of Systems: No fevers, chills, night sweats, chest pain, shortness of breath, nausea, vomiting, diarrhea, constipation, easy bleeding or bruising, headaches, dizziness, vision changes, fainting.   Blood pressure 120/82, pulse 81, temperature 98.6 F (37 C), temperature source Oral, resp. rate 17, height 5\' 6"  (1.676 m), weight 71.2 kg (157 lb), SpO2 100 %.  General appearance: alert, cooperative and appears stated age Head: Normocephalic, without obvious abnormality, atraumatic Neck: supple, symmetrical, trachea midline Resp: clear to auscultation bilaterally Cardio: regular rate and rhythm GI: non-tender Extremities: Intact sensation and capillary refill all digits.  +epl/fpl/io.  Wood embedded in palm at level of mp of [name] finger.  Oblique toward midline.  Pain with motion of digit, palpation at wound and dorsally.  Able to light flex dip of all digits. Pulses: 2+ and symmetric Skin: Skin color, texture, turgor normal. No rashes or lesions Neurologic: Grossly normal Incision/Wound: As above  Assessment/Plan Left palm embedded foreign body.  Recommend OR for removal and exploration of wound.  Risks, benefits, and alternatives of surgery were discussed and the patient agrees with the plan of care.   Hiawatha Merriott R 04/29/2017, 5:25 PM

## 2017-04-29 NOTE — Discharge Instructions (Signed)

## 2017-04-29 NOTE — Anesthesia Preprocedure Evaluation (Signed)
Anesthesia Evaluation  Patient identified by MRN, date of birth, ID band Patient awake    Reviewed: Allergy & Precautions, NPO status , Patient's Chart, lab work & pertinent test results  History of Anesthesia Complications Negative for: history of anesthetic complications  Airway Mallampati: II  TM Distance: >3 FB Neck ROM: Full    Dental  (+) Dental Advisory Given   Pulmonary neg pulmonary ROS, former smoker,    breath sounds clear to auscultation       Cardiovascular + dysrhythmias (2012 thought related to excessive caffeine intake, took self off of meds, has never recurred) Atrial Fibrillation  Rhythm:Regular Rate:Normal  '12 ECHO: normal   Neuro/Psych negative neurological ROS     GI/Hepatic negative GI ROS, Neg liver ROS,   Endo/Other  negative endocrine ROS  Renal/GU negative Renal ROS     Musculoskeletal negative musculoskeletal ROS (+)   Abdominal   Peds  Hematology negative hematology ROS (+)   Anesthesia Other Findings   Reproductive/Obstetrics                             Anesthesia Physical Anesthesia Plan  ASA: II and emergent  Anesthesia Plan: General   Post-op Pain Management:    Induction: Intravenous  Airway Management Planned: Oral ETT  Additional Equipment:   Intra-op Plan:   Post-operative Plan: Extubation in OR  Informed Consent: I have reviewed the patients History and Physical, chart, labs and discussed the procedure including the risks, benefits and alternatives for the proposed anesthesia with the patient or authorized representative who has indicated his/her understanding and acceptance.   Dental advisory given  Plan Discussed with: CRNA and Surgeon  Anesthesia Plan Comments: (Plan routine monitors, GETA)        Anesthesia Quick Evaluation

## 2017-04-29 NOTE — Anesthesia Procedure Notes (Signed)
Procedure Name: Intubation Date/Time: 04/29/2017 6:30 PM Performed by: Gwenyth AllegraADAMI, Kyler Germer Pre-anesthesia Checklist: Patient identified, Emergency Drugs available, Suction available, Patient being monitored and Timeout performed

## 2017-04-30 ENCOUNTER — Encounter (HOSPITAL_COMMUNITY): Payer: Self-pay | Admitting: Orthopedic Surgery

## 2017-04-30 NOTE — Op Note (Signed)
NAME:  Joe [name], Joe [name]                  ACCOUNT NO.:  MEDICAL RECORD NO.:  [id number]  LOCATION:                                 FACILITY:  PHYSICIAN:  Betha LoaKevin Elvie Palomo, MD        DATE OF BIRTH:  [birth year]  DATE OF PROCEDURE:  04/29/2017 DATE OF DISCHARGE:                              OPERATIVE REPORT   PREOPERATIVE DIAGNOSIS:  Left hand embedded foreign body.  POSTOPERATIVE DIAGNOSIS:  Left hand embedded foreign body.  PROCEDURE:   1. Left hand removal of embedded deep foreign body 2. Exploration of penetrating wound including digital nerve and artery and flexor tendon sheath.  SURGEON:  Betha LoaKevin Yaileen Hofferber, MD  ASSISTANT:  None.  ANESTHESIA:  General.  IV FLUIDS:  Per anesthesia flow sheet.  ESTIMATED BLOOD LOSS:  Minimal.  COMPLICATIONS:  None.  SPECIMENS:  None.  TOURNIQUET TIME:  21 minutes.  DISPOSITION:  Stable to PACU.  INDICATIONS:  Mr. 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  He was seen at Urgent Care where attempts to remove it were unsuccessful.  He presented to the emergency department.  Recommended operative removal with exploration of wound.  Risks, benefits and alternatives of the surgery were discussed including the risk of blood loss; infection; damage to nerves, vessels, tendons, ligaments, bone; failure of surgery; need for additional surgery; complications with wound healing; continued pain and infection.  He voiced understanding of these risks and elected to proceed.  OPERATIVE COURSE:  After being identified preoperatively by myself, the patient and I agreed upon the procedure and site of procedure.  Surgical site was marked.  Risks, benefits and alternatives of the surgery were reviewed and he wished to proceed.  Surgical consent had been signed. He was transferred to the operating room and placed on the operating room table in supine position with left upper extremity on  armboard.  He was given IV Ancef as preoperative antibiotic prophylaxis.  General anesthesia was induced by anesthesiologist.  Left upper extremity was prepped and draped in normal sterile orthopedic fashion.  A surgical pause was performed between the surgeons, anesthesia and operating room staff, and all were in agreement as to the patient, procedure and site of procedure.  Tourniquet at the proximal aspect of the extremity was inflated to 250 mmHg after exsanguination of the limb with an Esmarch bandage.  Incision was made in a Brunner-type fashion extending from the foreign body.  This was carried into subcutaneous tissues by spreading technique.  The foreign body was traced, it was superficial to the flexor tendon sheath in an oblique fashion.  The flexor tendon sheath was intact.  The foreign body was removed.  It was a long piece of wood, approximately 2.3-3 inches in length.  The ulnar digital nerve and artery were identified and were intact.  They were deep to the tract of the wound.  The radial digital nerve and artery were then identified. These were intact as well.  No remaining foreign body was noted.  There was no gross purulence, no gross contamination.  The wound was copiously irrigated with  sterile saline.  It was closed with 4-0 nylon in a horizontal mattress fashion.  A vessel loop drain was placed in the traumatic portion of the wound to allow for drainage as necessary.  The wound was injected with 8 mL of 0.25% plain Marcaine to aid in postoperative analgesia.  It was then dressed with sterile Xeroform, 4x4s and wrapped with a Coban dressing lightly.  Tourniquet was deflated at 21 minutes.  Fingertips were pink with brisk capillary refill after deflation of tourniquet.  Operative drapes were broken down, the patient was awakened from anesthesia safely.  He was transferred back to the stretcher and taken to PACU in stable condition.  I will see him back in the office  at the beginning of next week for postoperative followup.  I will give him Norco 5/325 one to two p.o. q.6 hours p.r.n. pain, dispensed #30.     Betha Loa, MD     KK/MEDQ  D:  04/29/2017  T:  04/29/2017  Job:  [id number]

## 2017-04-30 NOTE — Anesthesia Postprocedure Evaluation (Addendum)
Anesthesia Post Note  Patient: Joe [name]  Procedure(s) Performed: Procedure(s) (LRB): REMOVAL FOREIGN BODY EXTREMITY and wound exploration (Left)  Patient location during evaluation: PACU Anesthesia Type: General Level of consciousness: awake and alert Pain management: pain level controlled Vital Signs Assessment: post-procedure vital signs reviewed and stable Respiratory status: spontaneous breathing, nonlabored ventilation, respiratory function stable and patient connected to nasal cannula oxygen Cardiovascular status: blood pressure returned to baseline and stable Postop Assessment: no signs of nausea or vomiting Anesthetic complications: no       Last Vitals:  Vitals:   04/29/17 2000 04/29/17 2030  BP: 140/88 (!) 128/91  Pulse: (!) 102 98  Resp: 12   Temp:  36.7 C    Last Pain:  Vitals:   04/29/17 1726  TempSrc:   PainSc: 3                  Elvena Oyer

## 2017-05-31 NOTE — Addendum Note (Signed)
Addendum  created 05/31/17 1449 by Val EagleMoser, Erland Vivas, MD   Sign clinical note

## 2019-08-16 ENCOUNTER — Other Ambulatory Visit: Payer: Self-pay

## 2019-08-16 ENCOUNTER — Emergency Department (HOSPITAL_COMMUNITY): Payer: No Typology Code available for payment source

## 2019-08-16 ENCOUNTER — Emergency Department (HOSPITAL_COMMUNITY)
Admission: EM | Admit: 2019-08-16 | Discharge: 2019-08-16 | Disposition: A | Discharge status: Home / Self Care | Payer: No Typology Code available for payment source | Attending: Emergency Medicine | Admitting: Emergency Medicine

## 2019-08-16 ENCOUNTER — Encounter (HOSPITAL_COMMUNITY): Payer: Self-pay | Admitting: Emergency Medicine

## 2019-08-16 DIAGNOSIS — S71112A Laceration without foreign body, left thigh, initial encounter: Secondary | ICD-10-CM | POA: Insufficient documentation

## 2019-08-16 DIAGNOSIS — S71119A Laceration without foreign body, unspecified thigh, initial encounter: Secondary | ICD-10-CM

## 2019-08-16 DIAGNOSIS — W269XXA Contact with unspecified sharp object(s), initial encounter: Secondary | ICD-10-CM | POA: Insufficient documentation

## 2019-08-16 DIAGNOSIS — Z79899 Other long term (current) drug therapy: Secondary | ICD-10-CM | POA: Insufficient documentation

## 2019-08-16 DIAGNOSIS — S82891A Other fracture of right lower leg, initial encounter for closed fracture: Secondary | ICD-10-CM

## 2019-08-16 DIAGNOSIS — Y939 Activity, unspecified: Secondary | ICD-10-CM | POA: Insufficient documentation

## 2019-08-16 DIAGNOSIS — S3131XA Laceration without foreign body of scrotum and testes, initial encounter: Secondary | ICD-10-CM | POA: Diagnosis not present

## 2019-08-16 DIAGNOSIS — Y929 Unspecified place or not applicable: Secondary | ICD-10-CM | POA: Insufficient documentation

## 2019-08-16 DIAGNOSIS — Z87891 Personal history of nicotine dependence: Secondary | ICD-10-CM | POA: Insufficient documentation

## 2019-08-16 DIAGNOSIS — S71111A Laceration without foreign body, right thigh, initial encounter: Secondary | ICD-10-CM | POA: Diagnosis not present

## 2019-08-16 DIAGNOSIS — S99921A Unspecified injury of right foot, initial encounter: Secondary | ICD-10-CM | POA: Diagnosis present

## 2019-08-16 DIAGNOSIS — S8251XA Displaced fracture of medial malleolus of right tibia, initial encounter for closed fracture: Secondary | ICD-10-CM | POA: Diagnosis not present

## 2019-08-16 DIAGNOSIS — Y990 Civilian activity done for income or pay: Secondary | ICD-10-CM | POA: Diagnosis not present

## 2019-08-16 LAB — BASIC METABOLIC PANEL
Anion gap: 10 (ref 5–15)
BUN: 15 mg/dL (ref 6–20)
CO2: 26 mmol/L (ref 22–32)
Calcium: 9.2 mg/dL (ref 8.9–10.3)
Chloride: 103 mmol/L (ref 98–111)
Creatinine, Ser: 1.13 mg/dL (ref 0.61–1.24)
GFR calc Af Amer: >60 mL/min (ref >60)
GFR calc non Af Amer: >60 mL/min (ref >60)
Glucose, Bld: 114 mg/dL — ABNORMAL HIGH (ref 70–99)
Potassium: 3 mmol/L — ABNORMAL LOW (ref 3.5–5.1)
Sodium: 139 mmol/L (ref 135–145)

## 2019-08-16 LAB — CBC
HCT: 43.6 % (ref 39.0–52.0)
Hemoglobin: 14.4 g/dL (ref 13.0–17.0)
MCH: 28.7 pg (ref 26.0–34.0)
MCHC: 33 g/dL (ref 30.0–36.0)
MCV: 86.9 fL (ref 80.0–100.0)
Platelets: 230 10*3/uL (ref 150–400)
RBC: 5.02 MIL/uL (ref 4.22–5.81)
RDW: 13.6 % (ref 11.5–15.5)
WBC: 7.9 10*3/uL (ref 4.0–10.5)
nRBC: 0 % (ref 0.0–0.2)

## 2019-08-16 MED ORDER — SODIUM CHLORIDE 0.9 % IV SOLN
INTRAVENOUS | Status: DC
  Administered 2019-08-16: 14:00:00 via INTRAVENOUS

## 2019-08-16 MED ORDER — HYDROMORPHONE HCL 1 MG/ML IJ SOLN
1.0000 mg | Freq: Once | INTRAMUSCULAR | Status: AC
  Administered 2019-08-16: 1 mg via INTRAVENOUS
  Filled 2019-08-16: qty 1

## 2019-08-16 MED ORDER — POVIDONE-IODINE 10 % EX SOLN
CUTANEOUS | Status: AC
  Filled 2019-08-16: qty 15

## 2019-08-16 MED ORDER — HYDROMORPHONE HCL 1 MG/ML IJ SOLN
1.0000 mg | Freq: Once | INTRAMUSCULAR | Status: AC
  Administered 2019-08-16: 16:00:00 1 mg via INTRAVENOUS
  Filled 2019-08-16: qty 1

## 2019-08-16 MED ORDER — LIDOCAINE HCL (PF) 1 % IJ SOLN
INTRAMUSCULAR | Status: AC
  Filled 2019-08-16: qty 2

## 2019-08-16 MED ORDER — ONDANSETRON HCL 4 MG/2ML IJ SOLN
4.0000 mg | Freq: Once | INTRAMUSCULAR | Status: AC
  Administered 2019-08-16: 14:00:00 4 mg via INTRAVENOUS
  Filled 2019-08-16: qty 2

## 2019-08-16 MED ORDER — HYDROCODONE-ACETAMINOPHEN 5-325 MG PO TABS: 1.0000 | ORAL_TABLET | Freq: Four times a day (QID) | ORAL | 0 refills | Status: DC | PRN

## 2019-08-16 NOTE — ED Provider Notes (Signed)
LACERATION REPAIR Performed by: Alyse Low Authorized by: Alyse Low Consent: Verbal consent obtained. Risks and benefits: risks, benefits and alternatives were discussed Consent given by: patient Patient identity confirmed: provided demographic data Prepped and Draped in normal sterile fashion Wound explored  Laceration Location: mid scrotum   Laceration Length: 2cm  No Foreign Bodies seen or palpated  Anesthesia: local infiltration  Local anesthetic: lidocaine   Anesthetic total: 84ml  Irrigation method: syringe Amount of cleaning: standard  Skin closure: simple interrupted   Number of sutures: 6 sutures rapid   Technique: simple  Patient tolerance: Patient tolerated the procedure well with no immediate complications. LACERATION REPAIR Performed by: Alyse Low Authorized by: Alyse Low Consent: Verbal consent obtained. Risks and benefits: risks, benefits and alternatives were discussed Consent given by: patient Patient identity confirmed: provided demographic data Prepped and Draped in normal sterile fashion Wound explored  Laceration Location: scrotum  Laceration Length: 1 cm  No Foreign Bodies seen or palpated  Anesthesia: local infiltration  Local anesthetic: lidocaine 1 %  Anesthetic total: 2 ml  Irrigation method: syringe Amount of cleaning: standard  Skin closure: interrupted  Number of sutures: 2  Technique: simple rapid suture  Patient tolerance: Patient tolerated the procedure well with no immediate complications.  LACERATION REPAIR Performed by: Alyse Low Authorized by: Alyse Low Consent: Verbal consent obtained. Risks and benefits: risks, benefits and alternatives were discussed Consent given by: patient Patient identity confirmed: provided demographic data Prepped and Draped in normal sterile fashion Wound explored  Laceration Location: right leg  Laceration Length: 1.5cm  No Foreign Bodies seen or  palpated  Anesthesia: local infiltration  Local anesthetic: lidocaine   Anesthetic total: 3 ml  Irrigation method: syringe Amount of cleaning: standard  Skin closure: simple  Number of sutures: 2  Technique: simple rapid suture  Patient tolerance: Patient tolerated the procedure well with no immediate complications.    Fransico Meadow, [place] 08/16/19 1733    Fredia Sorrow, MD 08/26/19 1153

## 2019-08-16 NOTE — ED Provider Notes (Signed)
[facility] Provider Note   CSN: [id number] Arrival date & time: 08/16/19  1322    History   Chief Complaint Chief Complaint  Patient presents with   Trauma    HPI Joe [name] is a 28 y.o. male.     Patient status post work-related injury.  A rubber treaded vehicle ran over his right ankle.  Also had some metal pieces up on the top that cut him in the bilateral inner thigh and scrotal area.  No loss of consciousness.  No injuries to the chest no injuries to the abdomen no complaint of low back pain.  Nothing crushed his pelvis.  No neck pain no head pain other extremities without any injuries.  Patient states tetanus was recently updated.     Past Medical History:  Diagnosis Date   Paroxysmal atrial fibrillation [facility](HCC)    Sleep apnea     Patient Active Problem List   Diagnosis Date Noted   Paroxysmal atrial fibrillation [facility](HCC)     Past Surgical History:  Procedure Laterality Date   FOREIGN BODY REMOVAL Left 04/29/2017   Procedure: REMOVAL FOREIGN BODY EXTREMITY and wound exploration;  Surgeon: Betha LoaKevin Kuzma, MD;  Location: [facility] OR;  Service: Orthopedics;  Laterality: Left;        Home Medications    Prior to Admission medications   Medication Sig Start Date End Date Taking? Authorizing Provider  doxylamine, Sleep, (UNISOM) 25 MG tablet Take 25 mg by mouth at bedtime.    [provider]  HYDROcodone-acetaminophen (NORCO) 5-325 MG tablet 1-2 tabs po q6 hours prn pain 04/29/17   Betha LoaKuzma, Kevin, MD  HYDROcodone-acetaminophen (NORCO/VICODIN) 5-325 MG tablet Take 1 tablet by mouth every 6 (six) hours as needed. 08/16/19   Vanetta MuldersZackowski, Chung Chagoya, MD  ranitidine (ZANTAC) 150 MG tablet Take 1 tablet (150 mg total) by mouth 2 (two) times daily. Patient not taking: Reported on 04/29/2017 11/09/16 11/09/17  Myrna BlazerSchaevitz, David Matthew, MD  sulfamethoxazole-trimethoprim (BACTRIM DS) 800-160 MG tablet Take 1 tablet by mouth 2 (two) times daily. 04/29/17   Betha LoaKuzma,  Kevin, MD    Family History Family History  Family history unknown: Yes    Social History Social History   Tobacco Use   Smoking status: Former Smoker    Years: 2.00    Types: Cigarettes   Smokeless tobacco: Never Used  Substance Use Topics   Alcohol use: No   Drug use: No     Allergies   Patient has no known allergies.   Review of Systems Review of Systems  Constitutional: Negative for chills and fever.  HENT: Negative for congestion, rhinorrhea and sore throat.   Eyes: Negative for visual disturbance.  Respiratory: Negative for cough and shortness of breath.   Cardiovascular: Negative for chest pain and leg swelling.  Gastrointestinal: Negative for abdominal pain, diarrhea, nausea and vomiting.  Genitourinary: Negative for dysuria.  Musculoskeletal: Negative for back pain and neck pain.  Skin: Positive for wound. Negative for rash.  Neurological: Negative for dizziness, light-headedness and headaches.  Hematological: Does not bruise/bleed easily.  Psychiatric/Behavioral: Negative for confusion.     Physical Exam Updated Vital Signs BP 104/84 (BP Location: Left Arm)    Pulse (!) 102    Temp 98.2 F (36.8 C) (Oral)    Resp 16    Ht 1.676 m (5\' 6" )    Wt 65.8 kg    SpO2 99%    BMI 23.40 kg/m   Physical Exam Vitals signs and nursing note reviewed.  Constitutional:      Appearance: Normal appearance. He is well-developed.  HENT:     Head: Normocephalic and atraumatic.  Eyes:     Extraocular Movements: Extraocular movements intact.     Conjunctiva/sclera: Conjunctivae normal.     Pupils: Pupils are equal, round, and reactive to light.  Neck:     Musculoskeletal: Normal range of motion and neck supple.  Cardiovascular:     Rate and Rhythm: Normal rate and regular rhythm.     Heart sounds: No murmur.  Pulmonary:     Effort: Pulmonary effort is normal. No respiratory distress.     Breath sounds: Normal breath sounds.  Abdominal:     Palpations: Abdomen  is soft.     Tenderness: There is no abdominal tenderness.  Genitourinary:    Penis: Normal.      Comments: Patient circumcised.  2 lacerations on the scrotum not penetrating into the scrotum sac.  One measures 2 cm the other measures about 3 to 4 cm.  No active bleeding.  Also some lash directions in the inner thigh on both legs each about 2 to 3 cm in size. Musculoskeletal: Normal range of motion.        General: Swelling and tenderness present.     Comments: Patient's right ankle with swelling more lateral than medial.  Some bruising medially.  No proximal fibula tenderness.  Good dorsalis pedis pulse.  Good movement of toes.  Sensation intact.  No other injuries to any of the extremities.  Skin:    General: Skin is warm and dry.     Capillary Refill: Capillary refill takes less than 2 seconds.  Neurological:     General: No focal deficit present.     Mental Status: He is alert and oriented to person, place, and time.      ED Treatments / Results  Labs (all labs ordered are listed, but only abnormal results are displayed) Labs Reviewed  BASIC METABOLIC PANEL - Abnormal; Notable for the following components:      Result Value   Potassium 3.0 (*)    Glucose, Bld 114 (*)    All other components within normal limits  CBC    EKG None  Radiology Dg Tibia/fibula Right  Result Date: 08/16/2019 CLINICAL DATA:  Run over by piece of heavy equipment, crushing injury, right ankle deformity and pain EXAM: RIGHT ANKLE - COMPLETE 3+ VIEW; RIGHT TIBIA AND FIBULA - 2 VIEW; RIGHT FOOT COMPLETE - 3+ VIEW COMPARISON:  None. FINDINGS: No fracture or dislocation of the proximal tibia or fibula. The partially included right knee is unremarkable. There is a mildly displaced transverse fracture of the right medial malleolus, a mildly displaced coronal plane fracture of the posterior malleolus, and mild widening of the distal tibiofibular interval and ankle mortise. The distal fibula is intact.  Extensive soft tissue edema about the ankle. No fracture or dislocation of the right foot. Joint spaces are preserved. IMPRESSION: 1. No fracture or dislocation of the proximal tibia or fibula. The partially included right knee is unremarkable. 2. There is a mildly displaced transverse fracture of the right medial malleolus, a mildly displaced coronal plane fracture of the posterior malleolus, and mild widening of the distal tibiofibular interval and ankle mortise. The distal fibula is intact. Extensive soft tissue edema about the ankle. 3. No fracture or dislocation of the right foot. Joint spaces are preserved. Electronically Signed   By: Lauralyn PrimesAlex  Bibbey M.D.   On: 08/16/2019 14:21   Dg  Ankle Complete Right  Result Date: 08/16/2019 CLINICAL DATA:  Run over by piece of heavy equipment, crushing injury, right ankle deformity and pain EXAM: RIGHT ANKLE - COMPLETE 3+ VIEW; RIGHT TIBIA AND FIBULA - 2 VIEW; RIGHT FOOT COMPLETE - 3+ VIEW COMPARISON:  None. FINDINGS: No fracture or dislocation of the proximal tibia or fibula. The partially included right knee is unremarkable. There is a mildly displaced transverse fracture of the right medial malleolus, a mildly displaced coronal plane fracture of the posterior malleolus, and mild widening of the distal tibiofibular interval and ankle mortise. The distal fibula is intact. Extensive soft tissue edema about the ankle. No fracture or dislocation of the right foot. Joint spaces are preserved. IMPRESSION: 1. No fracture or dislocation of the proximal tibia or fibula. The partially included right knee is unremarkable. 2. There is a mildly displaced transverse fracture of the right medial malleolus, a mildly displaced coronal plane fracture of the posterior malleolus, and mild widening of the distal tibiofibular interval and ankle mortise. The distal fibula is intact. Extensive soft tissue edema about the ankle. 3. No fracture or dislocation of the right foot. Joint spaces are  preserved. Electronically Signed   By: Lauralyn PrimesAlex  Bibbey M.D.   On: 08/16/2019 14:21   Dg Foot Complete Right  Result Date: 08/16/2019 CLINICAL DATA:  Run over by piece of heavy equipment, crushing injury, right ankle deformity and pain EXAM: RIGHT ANKLE - COMPLETE 3+ VIEW; RIGHT TIBIA AND FIBULA - 2 VIEW; RIGHT FOOT COMPLETE - 3+ VIEW COMPARISON:  None. FINDINGS: No fracture or dislocation of the proximal tibia or fibula. The partially included right knee is unremarkable. There is a mildly displaced transverse fracture of the right medial malleolus, a mildly displaced coronal plane fracture of the posterior malleolus, and mild widening of the distal tibiofibular interval and ankle mortise. The distal fibula is intact. Extensive soft tissue edema about the ankle. No fracture or dislocation of the right foot. Joint spaces are preserved. IMPRESSION: 1. No fracture or dislocation of the proximal tibia or fibula. The partially included right knee is unremarkable. 2. There is a mildly displaced transverse fracture of the right medial malleolus, a mildly displaced coronal plane fracture of the posterior malleolus, and mild widening of the distal tibiofibular interval and ankle mortise. The distal fibula is intact. Extensive soft tissue edema about the ankle. 3. No fracture or dislocation of the right foot. Joint spaces are preserved. Electronically Signed   By: Lauralyn PrimesAlex  Bibbey M.D.   On: 08/16/2019 14:21    Procedures Procedures (including critical care time)  Medications Ordered in ED Medications  0.9 %  sodium chloride infusion ( Intravenous New Bag/Given 08/16/19 1356)  ondansetron (ZOFRAN) injection 4 mg (4 mg Intravenous Given 08/16/19 1357)  HYDROmorphone (DILAUDID) injection 1 mg (1 mg Intravenous Given 08/16/19 1357)  HYDROmorphone (DILAUDID) injection 1 mg (1 mg Intravenous Given 08/16/19 1544)     Initial Impression / Assessment and Plan / ED Course  I have reviewed the triage vital signs and the nursing  notes.  Pertinent labs & imaging results that were available during my care of the patient were reviewed by me and considered in my medical decision making (see chart for details).       Patient's lacerations will be sutured up by physician assistant.  Patient's main injury is the medial malleolus fracture posterior tibial fracture to the right ankle.  No dislocation.  But the mortise is a little loose based on the x-rays.  Fibula  intact.  Based on this patient will be placed in a posterior splint and stirrup type splint.  And crutches and follow-up with Raliegh Ip.  Patient states his tetanus is up-to-date.  Sutures can be removed and is 7 to 10 days.  Work note provided to be out of work for 2 weeks.  Hydrocodone provided for pain.  Medical screening examination/treatment/procedure(s) were conducted as a shared visit with non-physician practitioner(s) and myself.  I personally evaluated the patient during the encounter.      Final Clinical Impressions(s) / ED Diagnoses   Final diagnoses:  Closed fracture of right ankle, initial encounter  Scrotal laceration, initial encounter  Laceration of thigh, unspecified laterality, initial encounter    ED Discharge Orders         Ordered    HYDROcodone-acetaminophen (NORCO/VICODIN) 5-325 MG tablet  Every 6 hours PRN     08/16/19 1620           Fredia Sorrow, MD 08/16/19 1629

## 2019-08-16 NOTE — ED Triage Notes (Signed)
Pt was running a piece of equipment, the equipment ran over his right leg and the tire " cut" the inside of his leg. Pain 10/10. Pt screaming out. Unable to bear weight

## 2019-08-16 NOTE — ED Notes (Signed)
Patient transported to X-ray 

## 2019-08-16 NOTE — Discharge Instructions (Signed)
The splint in place and dry.  Use the crutches.  Work note provided.  Call [facility] for follow-up.  Call later today or first thing tomorrow.  They will make arrangements to see you for the fracture.  Sutures can be removed in 7 to 10 days.  Keep wounds dry for 24 hours.  Return for any evidence of wound infection.

## 2019-08-19 ENCOUNTER — Other Ambulatory Visit (HOSPITAL_COMMUNITY)
Admission: RE | Admit: 2019-08-19 | Discharge: 2019-08-19 | Disposition: A | Discharge status: Home / Self Care | Payer: 59 | Source: Ambulatory Visit | Attending: Orthopaedic Surgery | Admitting: Orthopaedic Surgery

## 2019-08-19 DIAGNOSIS — Z20828 Contact with and (suspected) exposure to other viral communicable diseases: Secondary | ICD-10-CM | POA: Diagnosis not present

## 2019-08-19 DIAGNOSIS — Z01812 Encounter for preprocedural laboratory examination: Secondary | ICD-10-CM | POA: Insufficient documentation

## 2019-08-20 LAB — SARS CORONAVIRUS 2 (TAT 6-24 HRS): SARS Coronavirus 2: NEGATIVE

## 2019-08-21 ENCOUNTER — Encounter (HOSPITAL_BASED_OUTPATIENT_CLINIC_OR_DEPARTMENT_OTHER): Payer: Self-pay | Admitting: *Deleted

## 2019-08-21 ENCOUNTER — Other Ambulatory Visit: Payer: Self-pay

## 2019-08-22 ENCOUNTER — Encounter (HOSPITAL_BASED_OUTPATIENT_CLINIC_OR_DEPARTMENT_OTHER)
Admission: RE | Admit: 2019-08-22 | Discharge: 2019-08-22 | Disposition: A | Discharge status: Home / Self Care | Payer: 59 | Source: Ambulatory Visit | Attending: Orthopaedic Surgery | Admitting: Orthopaedic Surgery

## 2019-08-22 DIAGNOSIS — Z01812 Encounter for preprocedural laboratory examination: Secondary | ICD-10-CM | POA: Diagnosis present

## 2019-08-22 DIAGNOSIS — Z79899 Other long term (current) drug therapy: Secondary | ICD-10-CM | POA: Diagnosis not present

## 2019-08-22 DIAGNOSIS — S8251XA Displaced fracture of medial malleolus of right tibia, initial encounter for closed fracture: Secondary | ICD-10-CM | POA: Diagnosis not present

## 2019-08-22 DIAGNOSIS — G4730 Sleep apnea, unspecified: Secondary | ICD-10-CM | POA: Diagnosis not present

## 2019-08-22 DIAGNOSIS — I480 Paroxysmal atrial fibrillation: Secondary | ICD-10-CM | POA: Diagnosis not present

## 2019-08-22 NOTE — Progress Notes (Signed)

## 2019-08-23 ENCOUNTER — Other Ambulatory Visit: Payer: Self-pay

## 2019-08-23 ENCOUNTER — Ambulatory Visit (HOSPITAL_BASED_OUTPATIENT_CLINIC_OR_DEPARTMENT_OTHER): Payer: No Typology Code available for payment source | Admitting: Certified Registered"

## 2019-08-23 ENCOUNTER — Ambulatory Visit (HOSPITAL_BASED_OUTPATIENT_CLINIC_OR_DEPARTMENT_OTHER)
Admission: RE | Admit: 2019-08-23 | Discharge: 2019-08-23 | Disposition: A | Discharge status: Home / Self Care | Payer: No Typology Code available for payment source | Attending: Orthopaedic Surgery | Admitting: Orthopaedic Surgery

## 2019-08-23 ENCOUNTER — Encounter (HOSPITAL_BASED_OUTPATIENT_CLINIC_OR_DEPARTMENT_OTHER): Payer: Self-pay

## 2019-08-23 ENCOUNTER — Encounter (HOSPITAL_BASED_OUTPATIENT_CLINIC_OR_DEPARTMENT_OTHER)
Admission: RE | Disposition: A | Discharge status: Home / Self Care | Payer: Self-pay | Source: Home / Self Care | Attending: Orthopaedic Surgery

## 2019-08-23 DIAGNOSIS — X58XXXA Exposure to other specified factors, initial encounter: Secondary | ICD-10-CM | POA: Insufficient documentation

## 2019-08-23 DIAGNOSIS — Y990 Civilian activity done for income or pay: Secondary | ICD-10-CM | POA: Insufficient documentation

## 2019-08-23 DIAGNOSIS — F17210 Nicotine dependence, cigarettes, uncomplicated: Secondary | ICD-10-CM | POA: Diagnosis not present

## 2019-08-23 DIAGNOSIS — I480 Paroxysmal atrial fibrillation: Secondary | ICD-10-CM | POA: Diagnosis not present

## 2019-08-23 DIAGNOSIS — S8251XA Displaced fracture of medial malleolus of right tibia, initial encounter for closed fracture: Secondary | ICD-10-CM | POA: Insufficient documentation

## 2019-08-23 DIAGNOSIS — Z79899 Other long term (current) drug therapy: Secondary | ICD-10-CM | POA: Diagnosis not present

## 2019-08-23 DIAGNOSIS — G4730 Sleep apnea, unspecified: Secondary | ICD-10-CM | POA: Insufficient documentation

## 2019-08-23 HISTORY — PX: ORIF ANKLE FRACTURE: SHX5408

## 2019-08-23 HISTORY — PX: SYNDESMOSIS REPAIR: SHX5182

## 2019-08-23 SURGERY — OPEN REDUCTION INTERNAL FIXATION (ORIF) ANKLE FRACTURE
Anesthesia: General | Site: Ankle | Laterality: Right

## 2019-08-23 MED ORDER — CEFAZOLIN SODIUM-DEXTROSE 2-3 GM-%(50ML) IV SOLR
INTRAVENOUS | Status: DC | PRN
  Administered 2019-08-23: 2 g via INTRAVENOUS

## 2019-08-23 MED ORDER — ONDANSETRON HCL 4 MG/2ML IJ SOLN
INTRAMUSCULAR | Status: DC | PRN
  Administered 2019-08-23: 4 mg via INTRAVENOUS

## 2019-08-23 MED ORDER — FENTANYL CITRATE (PF) 100 MCG/2ML IJ SOLN
INTRAMUSCULAR | Status: AC
  Filled 2019-08-23: qty 2

## 2019-08-23 MED ORDER — CHLORHEXIDINE GLUCONATE 4 % EX LIQD: 60.0000 mL | Freq: Once | CUTANEOUS | Status: DC

## 2019-08-23 MED ORDER — ONDANSETRON HCL 4 MG/2ML IJ SOLN: 4.0000 mg | Freq: Once | INTRAMUSCULAR | Status: DC | PRN

## 2019-08-23 MED ORDER — OXYCODONE HCL 5 MG PO TABS: ORAL_TABLET | ORAL | 0 refills | Status: AC

## 2019-08-23 MED ORDER — MIDAZOLAM HCL 2 MG/2ML IJ SOLN
1.0000 mg | INTRAMUSCULAR | Status: DC | PRN
  Administered 2019-08-23 (×2): 2 mg via INTRAVENOUS

## 2019-08-23 MED ORDER — PROPOFOL 10 MG/ML IV BOLUS
INTRAVENOUS | Status: AC
  Filled 2019-08-23: qty 20

## 2019-08-23 MED ORDER — CELECOXIB 200 MG PO CAPS: 200.0000 mg | ORAL_CAPSULE | Freq: Two times a day (BID) | ORAL | 1 refills | Status: AC

## 2019-08-23 MED ORDER — ROPIVACAINE HCL 5 MG/ML IJ SOLN
INTRAMUSCULAR | Status: DC | PRN
  Administered 2019-08-23 (×6): 5 mL via PERINEURAL

## 2019-08-23 MED ORDER — ACETAMINOPHEN 325 MG PO TABS: 325.0000 mg | ORAL_TABLET | ORAL | Status: DC | PRN

## 2019-08-23 MED ORDER — LACTATED RINGERS IV SOLN
INTRAVENOUS | Status: DC
  Administered 2019-08-23: 13:00:00 via INTRAVENOUS

## 2019-08-23 MED ORDER — CEFAZOLIN SODIUM-DEXTROSE 2-4 GM/100ML-% IV SOLN
INTRAVENOUS | Status: AC
  Filled 2019-08-23: qty 100

## 2019-08-23 MED ORDER — PROPOFOL 10 MG/ML IV BOLUS
INTRAVENOUS | Status: DC | PRN
  Administered 2019-08-23: 150 mg via INTRAVENOUS

## 2019-08-23 MED ORDER — OXYCODONE HCL 5 MG PO TABS: 5.0000 mg | ORAL_TABLET | Freq: Once | ORAL | Status: DC | PRN

## 2019-08-23 MED ORDER — LIDOCAINE HCL (CARDIAC) PF 100 MG/5ML IV SOSY
PREFILLED_SYRINGE | INTRAVENOUS | Status: DC | PRN
  Administered 2019-08-23: 100 mg via INTRAVENOUS

## 2019-08-23 MED ORDER — ONDANSETRON HCL 4 MG PO TABS: 4.0000 mg | ORAL_TABLET | Freq: Three times a day (TID) | ORAL | 1 refills | Status: AC | PRN

## 2019-08-23 MED ORDER — CEFAZOLIN SODIUM-DEXTROSE 2-4 GM/100ML-% IV SOLN: 2.0000 g | INTRAVENOUS | Status: DC

## 2019-08-23 MED ORDER — ASPIRIN 81 MG PO CHEW: 81.0000 mg | CHEWABLE_TABLET | Freq: Two times a day (BID) | ORAL | 11 refills | Status: AC

## 2019-08-23 MED ORDER — DEXAMETHASONE SODIUM PHOSPHATE 10 MG/ML IJ SOLN
INTRAMUSCULAR | Status: AC
  Filled 2019-08-23: qty 1

## 2019-08-23 MED ORDER — ONDANSETRON HCL 4 MG/2ML IJ SOLN
INTRAMUSCULAR | Status: AC
  Filled 2019-08-23: qty 2

## 2019-08-23 MED ORDER — ACETAMINOPHEN 160 MG/5ML PO SOLN: 325.0000 mg | ORAL | Status: DC | PRN

## 2019-08-23 MED ORDER — MIDAZOLAM HCL 2 MG/2ML IJ SOLN
INTRAMUSCULAR | Status: AC
  Filled 2019-08-23: qty 2

## 2019-08-23 MED ORDER — FENTANYL CITRATE (PF) 100 MCG/2ML IJ SOLN
50.0000 ug | INTRAMUSCULAR | Status: DC | PRN
  Administered 2019-08-23: 25 ug via INTRAVENOUS
  Administered 2019-08-23: 100 ug via INTRAVENOUS

## 2019-08-23 MED ORDER — OXYCODONE HCL 5 MG/5ML PO SOLN: 5.0000 mg | Freq: Once | ORAL | Status: DC | PRN

## 2019-08-23 MED ORDER — ACETAMINOPHEN 500 MG PO TABS: 1000.0000 mg | ORAL_TABLET | Freq: Three times a day (TID) | ORAL | 0 refills | Status: AC

## 2019-08-23 MED ORDER — DEXMEDETOMIDINE HCL IN NACL 200 MCG/50ML IV SOLN
INTRAVENOUS | Status: DC | PRN
  Administered 2019-08-23: 12 ug via INTRAVENOUS

## 2019-08-23 MED ORDER — KETOROLAC TROMETHAMINE 30 MG/ML IJ SOLN: 30.0000 mg | Freq: Once | INTRAMUSCULAR | Status: DC | PRN

## 2019-08-23 MED ORDER — FENTANYL CITRATE (PF) 100 MCG/2ML IJ SOLN: 25.0000 ug | INTRAMUSCULAR | Status: DC | PRN

## 2019-08-23 MED ORDER — ROPIVACAINE HCL 7.5 MG/ML IJ SOLN
INTRAMUSCULAR | Status: DC | PRN
  Administered 2019-08-23 (×4): 5 mL via PERINEURAL

## 2019-08-23 MED ORDER — LIDOCAINE 2% (20 MG/ML) 5 ML SYRINGE
INTRAMUSCULAR | Status: AC
  Filled 2019-08-23: qty 5

## 2019-08-23 SURGICAL SUPPLY — 70 items
BENZOIN TINCTURE PRP APPL 2/3 (GAUZE/BANDAGES/DRESSINGS) IMPLANT
BIT DRILL 2.6 CANN (BIT) ×3 IMPLANT
BLADE SURG 10 STRL SS (BLADE) ×3 IMPLANT
BLADE SURG 15 STRL LF DISP TIS (BLADE) ×2 IMPLANT
BLADE SURG 15 STRL SS (BLADE) ×4
BNDG COHESIVE 4X5 TAN STRL (GAUZE/BANDAGES/DRESSINGS) ×3 IMPLANT
BNDG ELASTIC 4X5.8 VLCR STR LF (GAUZE/BANDAGES/DRESSINGS) ×3 IMPLANT
BNDG ELASTIC 6X5.8 VLCR STR LF (GAUZE/BANDAGES/DRESSINGS) ×3 IMPLANT
BNDG ESMARK 4X9 LF (GAUZE/BANDAGES/DRESSINGS) IMPLANT
CLEANER CAUTERY TIP 5X5 PAD (MISCELLANEOUS) ×1 IMPLANT
CLOSURE WOUND 1/2 X4 (GAUZE/BANDAGES/DRESSINGS) ×2
COVER BACK TABLE REUSABLE LG (DRAPES) ×3 IMPLANT
COVER WAND RF STERILE (DRAPES) IMPLANT
CUFF TOURN SGL QUICK 24 (TOURNIQUET CUFF)
CUFF TOURN SGL QUICK 34 (TOURNIQUET CUFF) ×2
CUFF TRNQT CYL 24X4X16.5-23 (TOURNIQUET CUFF) IMPLANT
CUFF TRNQT CYL 34X4.125X (TOURNIQUET CUFF) ×1 IMPLANT
DECANTER SPIKE VIAL GLASS SM (MISCELLANEOUS) IMPLANT
DRAPE EXTREMITY T 121X128X90 (DISPOSABLE) ×3 IMPLANT
DRAPE IMP U-DRAPE 54X76 (DRAPES) ×3 IMPLANT
DRAPE OEC MINIVIEW 54X84 (DRAPES) ×3 IMPLANT
DRAPE U-SHAPE 47X51 STRL (DRAPES) ×3 IMPLANT
DRSG PAD ABDOMINAL 8X10 ST (GAUZE/BANDAGES/DRESSINGS) ×3 IMPLANT
DURAPREP 26ML APPLICATOR (WOUND CARE) ×3 IMPLANT
ELECT REM PT RETURN 9FT ADLT (ELECTROSURGICAL) ×3
ELECTRODE REM PT RTRN 9FT ADLT (ELECTROSURGICAL) ×1 IMPLANT
GAUZE SPONGE 4X4 12PLY STRL (GAUZE/BANDAGES/DRESSINGS) ×3 IMPLANT
GLOVE BIO SURGEON STRL SZ7 (GLOVE) ×3 IMPLANT
GLOVE BIO SURGEON STRL SZ8 (GLOVE) ×6 IMPLANT
GLOVE BIOGEL PI IND STRL 8 (GLOVE) ×3 IMPLANT
GLOVE BIOGEL PI INDICATOR 8 (GLOVE) ×6
GLOVE ECLIPSE 8.0 STRL XLNG CF (GLOVE) ×3 IMPLANT
GOWN STRL REUS W/ TWL LRG LVL3 (GOWN DISPOSABLE) ×1 IMPLANT
GOWN STRL REUS W/TWL LRG LVL3 (GOWN DISPOSABLE) ×2
GOWN STRL REUS W/TWL XL LVL3 (GOWN DISPOSABLE) ×3 IMPLANT
GUIDEWIRE 1.35MM (WIRE) ×3 IMPLANT
NEEDLE HYPO 22GX1.5 SAFETY (NEEDLE) IMPLANT
NS IRRIG 1000ML POUR BTL (IV SOLUTION) ×3 IMPLANT
PACK BASIN DAY SURGERY FS (CUSTOM PROCEDURE TRAY) ×3 IMPLANT
PAD CAST 4YDX4 CTTN HI CHSV (CAST SUPPLIES) ×1 IMPLANT
PAD CLEANER CAUTERY TIP 5X5 (MISCELLANEOUS) ×2
PADDING CAST ABS 4INX4YD NS (CAST SUPPLIES) ×4
PADDING CAST ABS COTTON 4X4 ST (CAST SUPPLIES) ×2 IMPLANT
PADDING CAST COTTON 4X4 STRL (CAST SUPPLIES) ×2
PADDING CAST COTTON 6X4 STRL (CAST SUPPLIES) ×3 IMPLANT
PENCIL BUTTON HOLSTER BLD 10FT (ELECTRODE) ×3 IMPLANT
SCREW LP CANN 4.0X45MM (Screw) ×6 IMPLANT
SLEEVE SCD COMPRESS KNEE MED (MISCELLANEOUS) ×3 IMPLANT
SPLINT FAST PLASTER 5X30 (CAST SUPPLIES) ×40
SPLINT PLASTER CAST FAST 5X30 (CAST SUPPLIES) ×20 IMPLANT
SPONGE LAP 18X18 RF (DISPOSABLE) ×3 IMPLANT
STRIP CLOSURE SKIN 1/2X4 (GAUZE/BANDAGES/DRESSINGS) ×4 IMPLANT
SUCTION FRAZIER HANDLE 10FR (MISCELLANEOUS) ×4
SUCTION TUBE FRAZIER 10FR DISP (MISCELLANEOUS) ×2 IMPLANT
SUT MNCRL AB 4-0 PS2 18 (SUTURE) ×6 IMPLANT
SUT MON AB 3-0 SH 27 (SUTURE)
SUT MON AB 3-0 SH27 (SUTURE) IMPLANT
SUT VIC AB 0 CT1 27 (SUTURE) ×2
SUT VIC AB 0 CT1 27XBRD ANBCTR (SUTURE) ×1 IMPLANT
SUT VIC AB 3-0 SH 27 (SUTURE) ×4
SUT VIC AB 3-0 SH 27X BRD (SUTURE) ×2 IMPLANT
SYR BULB 3OZ (MISCELLANEOUS) ×3 IMPLANT
SYR CONTROL 10ML LL (SYRINGE) IMPLANT
TOWEL GREEN STERILE FF (TOWEL DISPOSABLE) ×6 IMPLANT
TUBE CONNECTING 20'X1/4 (TUBING) ×1
TUBE CONNECTING 20X1/4 (TUBING) ×2 IMPLANT
UNDERPAD 30X30 (UNDERPADS AND DIAPERS) ×3 IMPLANT
WASHER (Orthopedic Implant) ×4 IMPLANT
WASHER ORTHO 7X (Orthopedic Implant) ×2 IMPLANT
YANKAUER SUCT BULB TIP NO VENT (SUCTIONS) ×3 IMPLANT

## 2019-08-23 NOTE — Anesthesia Procedure Notes (Signed)
Anesthesia Regional Block: Adductor canal block   Pre-Anesthetic Checklist: ,, timeout performed, Correct Patient, Correct Site, Correct Laterality, Correct Procedure, Correct Position, site marked, Risks and benefits discussed,  Surgical consent,  Pre-op evaluation,  At surgeon's request and post-op pain management  Laterality: Lower and Right  Prep: chloraprep       Needles:  Injection technique: Single-shot  Needle Type: Echogenic Stimulator Needle     Needle Length: 10cm  Needle Gauge: 21   Needle insertion depth: 2 cm   Additional Needles:   Procedures:,,,, ultrasound used (permanent image in chart),,,,  Narrative:  Start time: 08/23/2019 1:58 PM End time: 08/23/2019 2:06 PM Injection made incrementally with aspirations every 5 mL. Anesthesiologist: Lyn Hollingshead, MD

## 2019-08-23 NOTE — Discharge Instructions (Signed)

## 2019-08-23 NOTE — Op Note (Signed)
Orthopaedic Surgery Operative Note (CSN: [id number])  Joe [name]  [birth year] Date of Surgery: 08/23/2019   Diagnoses:  DISPLACED FRACTURE OF MEDIAL MALLEOULUS TIBIA  Procedure: Open reduction internal fixation of medial distal tibial fracture   Operative Finding Successful completion of the planned procedure.  Good fixation of the medial malleolus fracture with anatomic reduction.  Patient had great purchase with his 2 screws.  The syndesmosis was stable after fixation of the medial malleolus.  Post-operative plan: The patient will be nonweightbearing in a splint with transition to a walking boot after the first visit.  The patient will be discharged home.  DVT prophylaxis aspirin 81 mg twice a day.   Pain control with PRN pain medication preferring oral medicines.  Follow up plan will be scheduled in approximately 7 days for incision check and XR.  Post-Op Diagnosis: Same Surgeons:Primary: Hiram Gash, MD Assistants: Joya Gaskins, OPAC Location: [facility] OR ROOM 5 Anesthesia: General with regional block Antibiotics: Ancef 2g preop and 1 g vancomycin powder locally Tourniquet time:  Total Tourniquet Time Documented: Thigh (Right) - 59 minutes Total: Thigh (Right) - 59 minutes  Estimated Blood Loss: Minimal Complications: None Specimens: None Implants: Implant Name Type Inv. Item Serial No. Manufacturer Lot No. LRB No. Used Action  SCREW LP CANN [id number] - [id number] Screw SCREW LP CANN 4.0X45MM  [employer] [id number] Right 2 Implanted  WASHER - [id number] Orthopedic Implant WASHER  [employer] [id number] Right 2 Implanted    Indications for Surgery:   Joe [name] is a 28 y.o. male with work-related ankle fracture resulting in a displaced medial malleolar fracture.  Benefits and risks of operative and nonoperative management were discussed prior to surgery with patient/guardian(s) and informed consent form was completed.  Specific risks including infection, need for  additional surgery,, nonunion, malunion, infection and stiffness.   Procedure:   The patient was identified properly. Informed consent was obtained and the surgical site was marked. The patient was taken up to suite where general anesthesia was induced.  The patient was positioned supine on a regular bed with a bone foam.  The right ankle was prepped and draped in the usual sterile fashion.  Timeout was performed before the beginning of the case.  Tourniquet was used for the above duration.  A longitudinal incision was made over the anterior aspect of the medial malleolus were able to identify the fracture site itself. A point the fracture site was cleared of interposed periosteum and we were able to obtain an anatomic reduction was held with point-to-point clamp. At this point we placed 2 partially threaded 45 mm cannulated screws with washers across the fracture site achieving good purchase and good compression with each screw.   We then performed an external rotation stress test under live fluoroscopic imaging and determined that the syndesmosis was stable.  We irrigated the wound copiously before placing local antibiotic as listed above.  Close the incision in a multilayer fashion with absorbable suture.  Sterile dressing was placed.  Well-padded well molded short leg splint was placed.  Patient was awoken taken to PACU in stable condition.  Joya Gaskins, OPA-C, present and scrubbed throughout the case, critical for completion in a timely fashion, and for retraction, instrumentation, closure.

## 2019-08-23 NOTE — Anesthesia Preprocedure Evaluation (Signed)
Anesthesia Evaluation  Patient identified by MRN, date of birth, ID band Patient awake    Reviewed: Allergy & Precautions, NPO status , Patient's Chart, lab work & pertinent test results  Airway Mallampati: I       Dental no notable dental hx. (+) Teeth Intact   Pulmonary Current Smoker,    Pulmonary exam normal breath sounds clear to auscultation       Cardiovascular negative cardio ROS Normal cardiovascular exam Rhythm:Regular Rate:Normal     Neuro/Psych negative neurological ROS  negative psych ROS   GI/Hepatic negative GI ROS, Neg liver ROS,   Endo/Other  negative endocrine ROS  Renal/GU negative Renal ROS  negative genitourinary   Musculoskeletal   Abdominal Normal abdominal exam  (+)   Peds  Hematology negative hematology ROS (+)   Anesthesia Other Findings   Reproductive/Obstetrics                             Anesthesia Physical Anesthesia Plan  ASA: II  Anesthesia Plan: General   Post-op Pain Management:  Regional for Post-op pain   Induction: Intravenous  PONV Risk Score and Plan: 1 and Ondansetron, Dexamethasone and Midazolam  Airway Management Planned: LMA  Additional Equipment: None  Intra-op Plan:   Post-operative Plan:   Informed Consent: I have reviewed the patients History and Physical, chart, labs and discussed the procedure including the risks, benefits and alternatives for the proposed anesthesia with the patient or authorized representative who has indicated his/her understanding and acceptance.     Dental advisory given  Plan Discussed with: CRNA  Anesthesia Plan Comments:         Anesthesia Quick Evaluation

## 2019-08-23 NOTE — Anesthesia Procedure Notes (Signed)
Procedure Name: LMA Insertion Performed by: Benjy Kana M, CRNA Pre-anesthesia Checklist: Patient identified, Emergency Drugs available, Suction available, Patient being monitored and Timeout performed Patient Re-evaluated:Patient Re-evaluated prior to induction Oxygen Delivery Method: Circle system utilized Preoxygenation: Pre-oxygenation with 100% oxygen Induction Type: IV induction LMA: LMA inserted LMA Size: 4.0 Tube type: Oral Number of attempts: 1 Placement Confirmation: positive ETCO2,  CO2 detector and breath sounds checked- equal and bilateral Tube secured with: Tape Dental Injury: Teeth and Oropharynx as per pre-operative assessment        

## 2019-08-23 NOTE — Progress Notes (Signed)
Assisted Dr. Hatchett with right, ultrasound guided, popliteal, adductor canal block. Side rails up, monitors on throughout procedure. See vital signs in flow sheet. Tolerated Procedure well. 

## 2019-08-23 NOTE — H&P (Signed)
PREOPERATIVE H&P  Chief Complaint: DISPLACED FRACTURE OF MEDIAL MALLEOULUS TIBIA  HPI: Joe [name] is a 28 y.o. male who presents for preoperative history and physical with a diagnosis of DISPLACED FRACTURE OF MEDIAL MALLEOULUS TIBIA. Symptoms are rated as moderate to severe, and have been worsening.  This is significantly impairing activities of daily living.  Please see my clinic note for full details on this patient's care.  He has elected for surgical management.   Past Medical History:  Diagnosis Date  . Paroxysmal atrial fibrillation (HCC)   . Sleep apnea    mild and no CPAP prescribed   Past Surgical History:  Procedure Laterality Date  . FOREIGN BODY REMOVAL Left 04/29/2017   Procedure: REMOVAL FOREIGN BODY EXTREMITY and wound exploration;  Surgeon: Betha LoaKevin Kuzma, MD;  Location: [facility] OR;  Service: Orthopedics;  Laterality: Left;   Social History   Socioeconomic History  . Marital status: Single    Spouse name: Not on file  . Number of children: Not on file  . Years of education: Not on file  . Highest education level: Not on file  Occupational History  . Not on file  Social Needs  . Financial resource strain: Not on file  . Food insecurity    Worry: Not on file    Inability: Not on file  . Transportation needs    Medical: Not on file    Non-medical: Not on file  Tobacco Use  . Smoking status: Current Every Day Smoker    Packs/day: 1.00    Years: 2.00    Pack years: 2.00    Types: Cigarettes  . Smokeless tobacco: Former Engineer, waterUser  Substance and Sexual Activity  . Alcohol use: Not Currently    Comment: quit june 2020  . Drug use: No  . Sexual activity: Not on file  Lifestyle  . Physical activity    Days per week: Not on file    Minutes per session: Not on file  . Stress: Not on file  Relationships  . Social Musicianconnections    Talks on phone: Not on file    Gets together: Not on file    Attends religious service: Not on file    Active member of club or  organization: Not on file    Attends meetings of clubs or organizations: Not on file    Relationship status: Not on file  Other Topics Concern  . Not on file  Social History Narrative  . Not on file   Family History  Family history unknown: Yes   No Known Allergies Prior to Admission medications   Medication Sig Start Date End Date Taking? Authorizing Provider  famotidine (PEPCID) 10 MG tablet Take 10 mg by mouth 2 (two) times daily.   Yes [provider]  HYDROcodone-acetaminophen (NORCO) 5-325 MG tablet 1-2 tabs po q6 hours prn pain 04/29/17  Yes Betha LoaKuzma, Kevin, MD     Positive ROS: All other systems have been reviewed and were otherwise negative with the exception of those mentioned in the HPI and as above.  Physical Exam: General: Alert, no acute distress Cardiovascular: No pedal edema Respiratory: No cyanosis, no use of accessory musculature GI: No organomegaly, abdomen is soft and non-tender Skin: No lesions in the area of chief complaint Neurologic: Sensation intact distally Psychiatric: Patient is competent for consent with normal mood and affect Lymphatic: No axillary or cervical lymphadenopathy  MUSCULOSKELETAL: R ankle splint in place, cdi, NVID  Assessment: DISPLACED FRACTURE OF MEDIAL MALLEOULUS TIBIA  Plan: Plan for Procedure(s): OPEN REDUCTION INTERNAL FIXATION (ORIF) ANKLE FRACTURE OPEN REDUCTION INTERNAL FIXATION  OF SYNDESMOSIS REPAIR  The risks benefits and alternatives were discussed with the patient including but not limited to the risks of nonoperative treatment, versus surgical intervention including infection, bleeding, nerve injury,  blood clots, cardiopulmonary complications, morbidity, mortality, among others, and they were willing to proceed.   Hiram Gash, MD  08/23/2019 2:09 PM

## 2019-08-23 NOTE — Anesthesia Postprocedure Evaluation (Signed)
Anesthesia Post Note  Patient: TOBIN WITUCKI  Procedure(s) Performed: OPEN REDUCTION INTERNAL FIXATION (ORIF) ANKLE FRACTURE (Right Ankle) OPEN REDUCTION INTERNAL FIXATION  OF SYNDESMOSIS REPAIR (Right Ankle)     Patient location during evaluation: PACU Anesthesia Type: General Level of consciousness: sedated Pain management: pain level controlled Vital Signs Assessment: post-procedure vital signs reviewed and stable Respiratory status: spontaneous breathing Cardiovascular status: stable Postop Assessment: no apparent nausea or vomiting Anesthetic complications: no    Last Vitals:  Vitals:   08/23/19 1630 08/23/19 1645  BP: 102/71 116/77  Pulse: 86 85  Resp: 13 16  Temp:    SpO2: 100% 100%    Last Pain:  Vitals:   08/23/19 1630  TempSrc:   PainSc: 0-No pain   Pain Goal:        RLE Motor Response: No movement due to regional block (08/23/19 1630) RLE Sensation: Numbness, No pain (08/23/19 1630)        Huston Foley

## 2019-08-23 NOTE — Anesthesia Procedure Notes (Signed)
Anesthesia Regional Block: Popliteal block   Pre-Anesthetic Checklist: ,, timeout performed, Correct Patient, Correct Site, Correct Laterality, Correct Procedure, Correct Position, site marked, Risks and benefits discussed,  Surgical consent,  Pre-op evaluation,  At surgeon's request and post-op pain management  Laterality: Lower and Right  Prep: chloraprep       Needles:  Injection technique: Single-shot  Needle Type: Echogenic Stimulator Needle     Needle Length: 10cm  Needle Gauge: 21     Additional Needles:   Procedures:,,,, ultrasound used (permanent image in chart),,,,  Narrative:  Start time: 08/23/2019 2:08 PM End time: 08/23/2019 2:14 PM Injection made incrementally with aspirations every 5 mL.  Performed by: Personally  Anesthesiologist: Lyn Hollingshead, MD

## 2019-08-23 NOTE — Transfer of Care (Signed)
Immediate Anesthesia Transfer of Care Note  Patient: Joe [name]  Procedure(s) Performed: OPEN REDUCTION INTERNAL FIXATION (ORIF) ANKLE FRACTURE (Right Ankle) OPEN REDUCTION INTERNAL FIXATION  OF SYNDESMOSIS REPAIR (Right Ankle)  Patient Location: PACU  Anesthesia Type:General and Regional  Level of Consciousness: awake, alert  and oriented  Airway & Oxygen Therapy: Patient Spontanous Breathing and Patient connected to face mask oxygen  Post-op Assessment: Report given to RN and Post -op Vital signs reviewed and stable  Post vital signs: Reviewed and stable  Last Vitals:  Vitals Value Taken Time  BP 115/63 08/23/19 1546  Temp 36.3 C 08/23/19 1546  Pulse 66 08/23/19 1551  Resp 11 08/23/19 1551  SpO2 100 % 08/23/19 1551  Vitals shown include unvalidated device data.  Last Pain:  Vitals:   08/23/19 1311  TempSrc: Oral  PainSc: 1          Complications: No apparent anesthesia complications

## 2019-08-24 ENCOUNTER — Encounter (HOSPITAL_BASED_OUTPATIENT_CLINIC_OR_DEPARTMENT_OTHER): Payer: Self-pay | Admitting: Orthopaedic Surgery

## 2019-08-24 NOTE — Addendum Note (Signed)
Addendum  created 08/24/19 1120 by Tawni Millers, CRNA   Charge Capture section accepted

## 2020-03-09 ENCOUNTER — Ambulatory Visit: Payer: 59 | Attending: Internal Medicine

## 2020-03-09 DIAGNOSIS — Z23 Encounter for immunization: Secondary | ICD-10-CM

## 2020-03-09 NOTE — Progress Notes (Signed)
   Covid-19 Vaccination Clinic  Name:  Joe [name]    MRN: [id number] DOB: [birth year]  03/09/2020  Mr. Joe [name] was observed post Covid-19 immunization for 15 minutes without incident. He was provided with Vaccine Information Sheet and instruction to access the V-Safe system.   Mr. Joe [name] was instructed to call 911 with any severe reactions post vaccine: Marland Kitchen Difficulty breathing  . Swelling of face and throat  . A fast heartbeat  . A bad rash all over body  . Dizziness and weakness   Immunizations Administered    Name Date Dose VIS Date Route   Pfizer COVID-19 Vaccine 03/09/2020  9:36 AM 0.3 mL 12/08/2019 Intramuscular   Manufacturer: [employer], [employer]   Lot: [id number]   NDC: [address]-9

## 2020-04-03 ENCOUNTER — Ambulatory Visit: Payer: 59 | Attending: Internal Medicine

## 2020-04-03 DIAGNOSIS — Z23 Encounter for immunization: Secondary | ICD-10-CM

## 2020-04-03 NOTE — Progress Notes (Signed)
   Covid-19 Vaccination Clinic  Name:  Joe [name]    MRN: [id number] DOB: May 04, 1991  04/03/2020  Mr. [name] was observed post Covid-19 immunization for 15 minutes without incident. He was provided with Vaccine Information Sheet and instruction to access the V-Safe system.   Mr. [name] was instructed to call 911 with any severe reactions post vaccine: Marland Kitchen Difficulty breathing  . Swelling of face and throat  . A fast heartbeat  . A bad rash all over body  . Dizziness and weakness   Immunizations Administered    Name Date Dose VIS Date Route   Pfizer COVID-19 Vaccine 04/03/2020  9:07 AM 0.3 mL 12/08/2019 Intramuscular   Manufacturer: [employer], [employer]   Lot: [id number]   NDC: [address]-4

## 2020-08-12 IMAGING — DX RIGHT FOOT COMPLETE - 3+ VIEW
3 series · 3 of 3 positions shown · non-contrast
Comparison: None.

CLINICAL DATA: Run over by piece of heavy equipment, crushing
injury, right ankle deformity and pain

EXAM:
RIGHT ANKLE - COMPLETE 3+ VIEW; RIGHT TIBIA AND FIBULA - 2 VIEW;
RIGHT FOOT COMPLETE - 3+ VIEW

[foot ap]
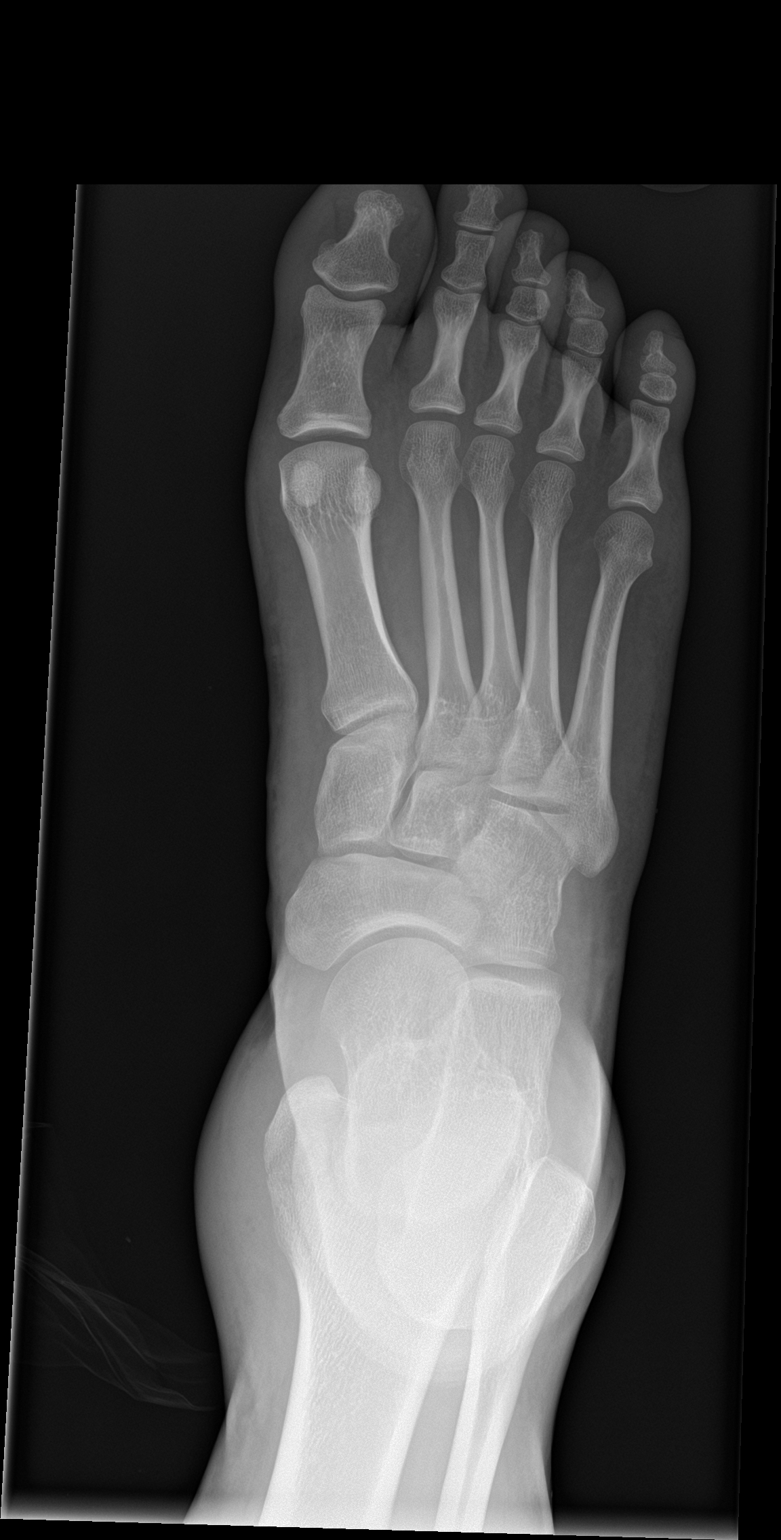

[foot obl]
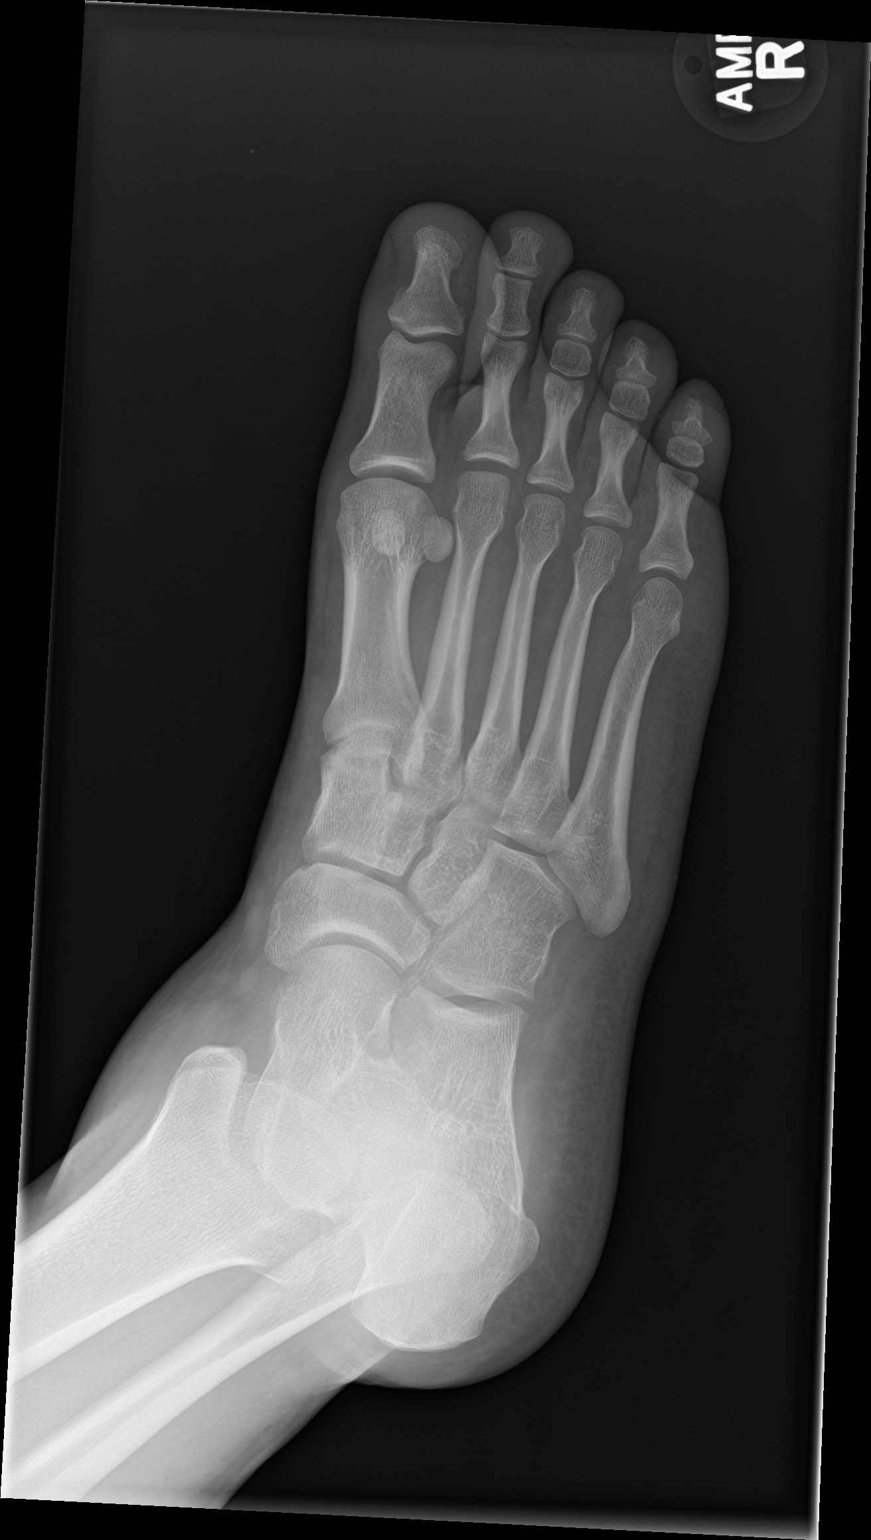

[foot lat]
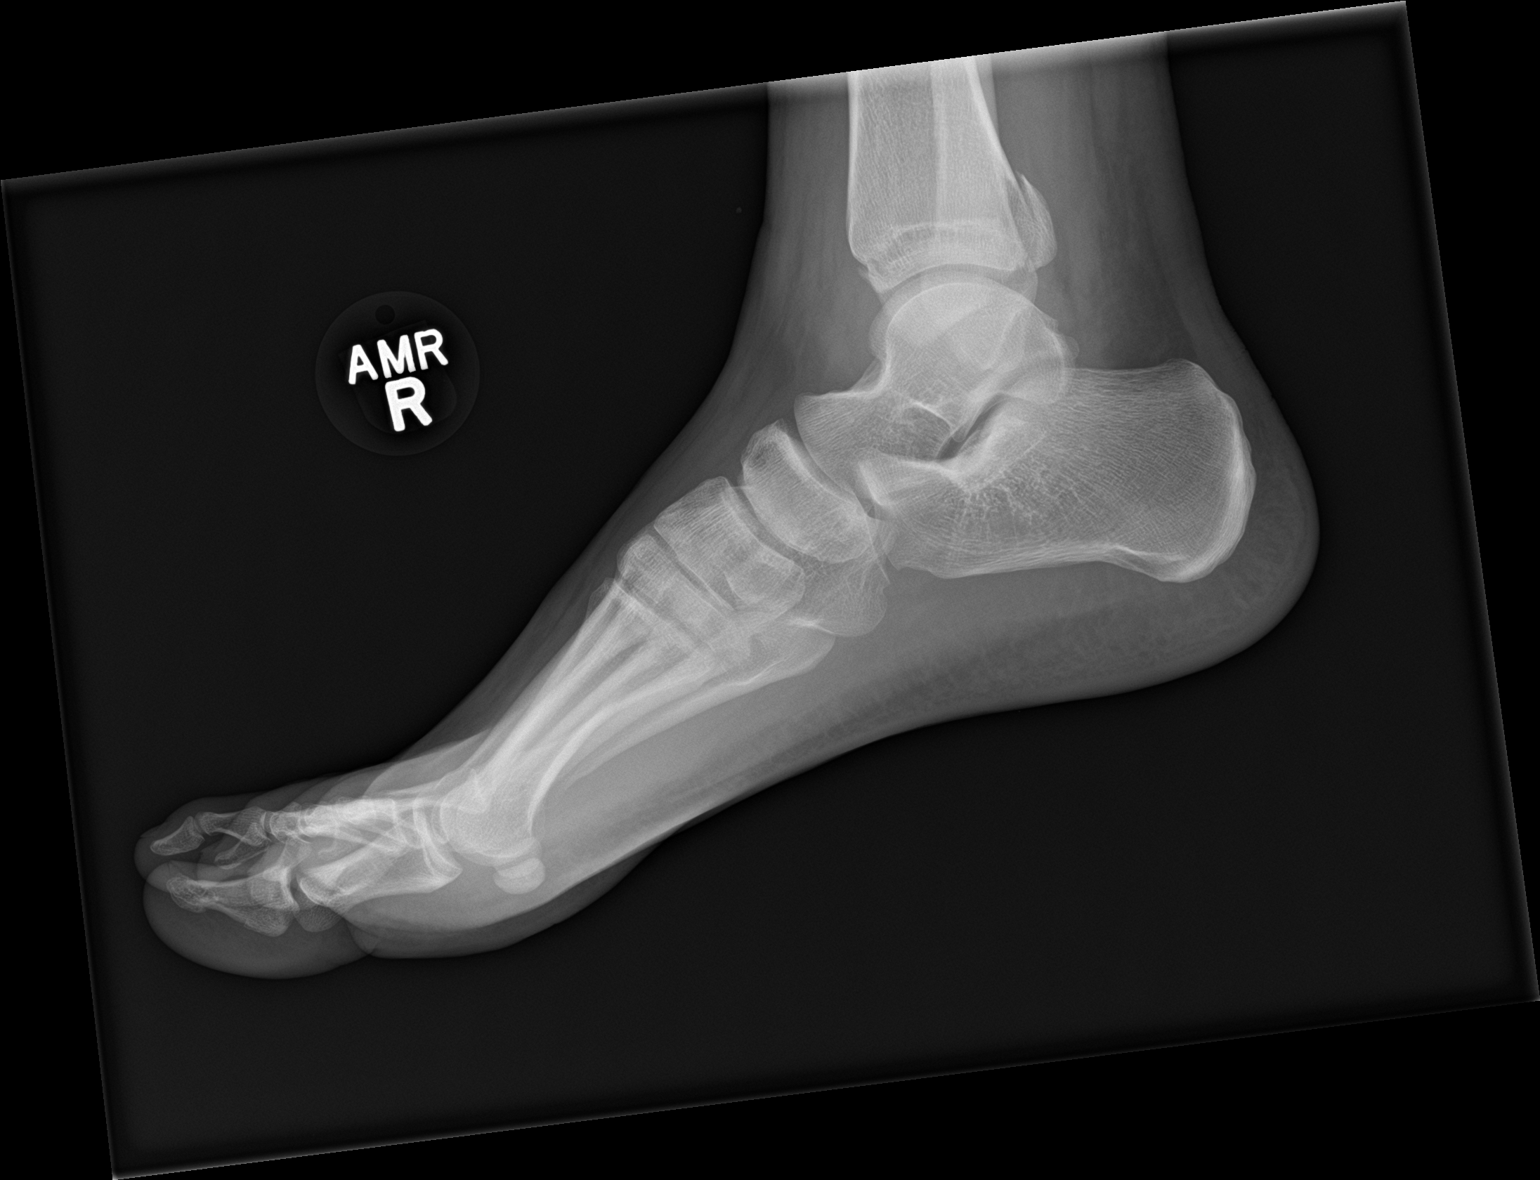

[3 of 3 positions shown; findings below may reference images not displayed]

FINDINGS: No fracture or dislocation of the proximal tibia or fibula. The
partially included right knee is unremarkable.

There is a mildly displaced transverse fracture of the right medial
malleolus, a mildly displaced coronal plane fracture of the
posterior malleolus, and mild widening of the distal tibiofibular
interval and ankle mortise. The distal fibula is intact. Extensive
soft tissue edema about the ankle.

No fracture or dislocation of the right foot. Joint spaces are
preserved.
IMPRESSION: 1. No fracture or dislocation of the proximal tibia or fibula. The
partially included right knee is unremarkable.

2. There is a mildly displaced transverse fracture of the right
medial malleolus, a mildly displaced coronal plane fracture of the
posterior malleolus, and mild widening of the distal tibiofibular
interval and ankle mortise. The distal fibula is intact. Extensive
soft tissue edema about the ankle.

3. No fracture or dislocation of the right foot. Joint spaces are
preserved.

## 2020-08-12 IMAGING — DX RIGHT ANKLE - COMPLETE 3+ VIEW
3 series · 3 of 3 positions shown · non-contrast
Comparison: None.

CLINICAL DATA: Run over by piece of heavy equipment, crushing
injury, right ankle deformity and pain

EXAM:
RIGHT ANKLE - COMPLETE 3+ VIEW; RIGHT TIBIA AND FIBULA - 2 VIEW;
RIGHT FOOT COMPLETE - 3+ VIEW

[ankle ap]
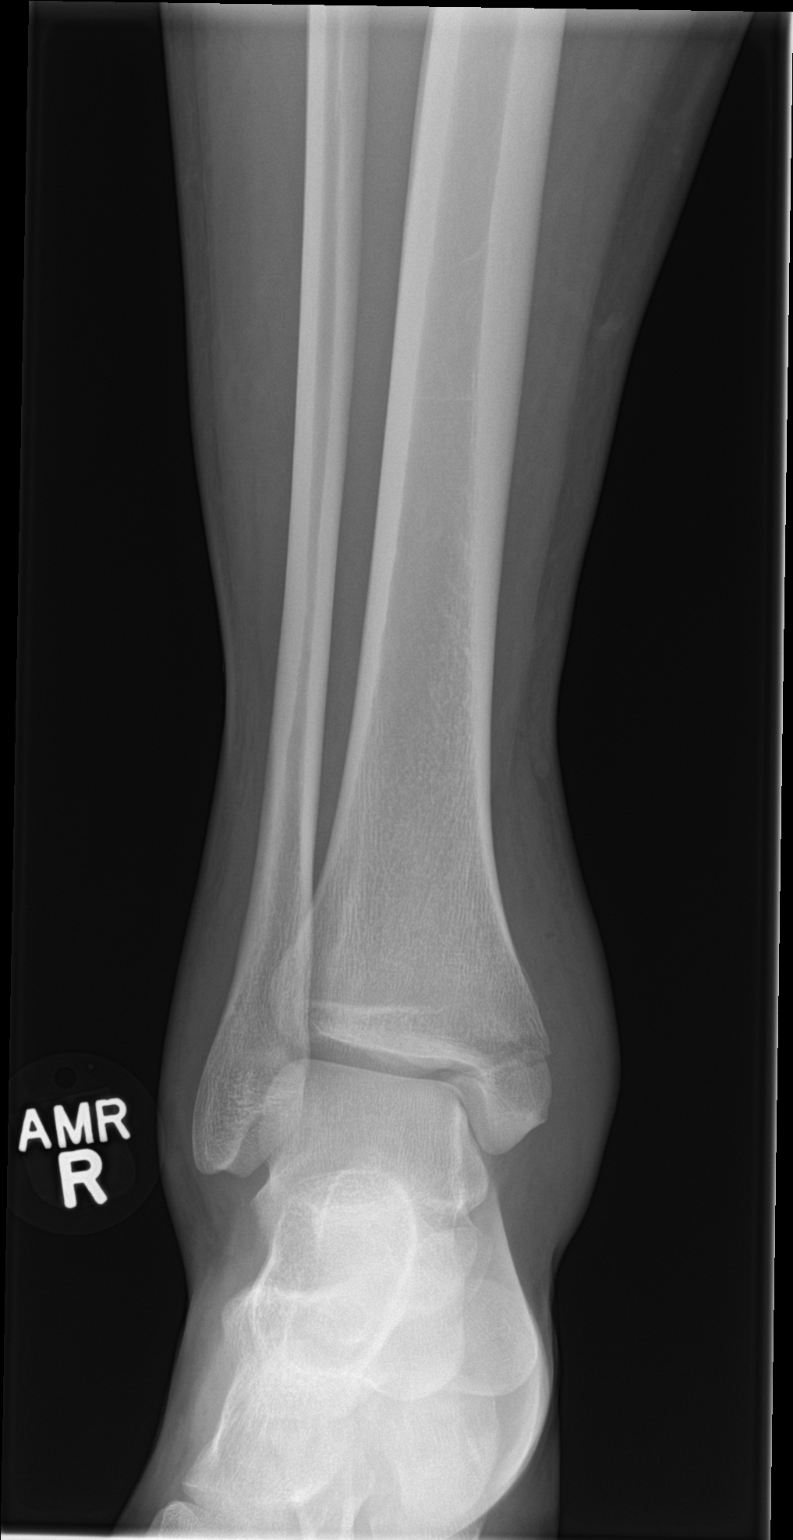

[ankle obl]
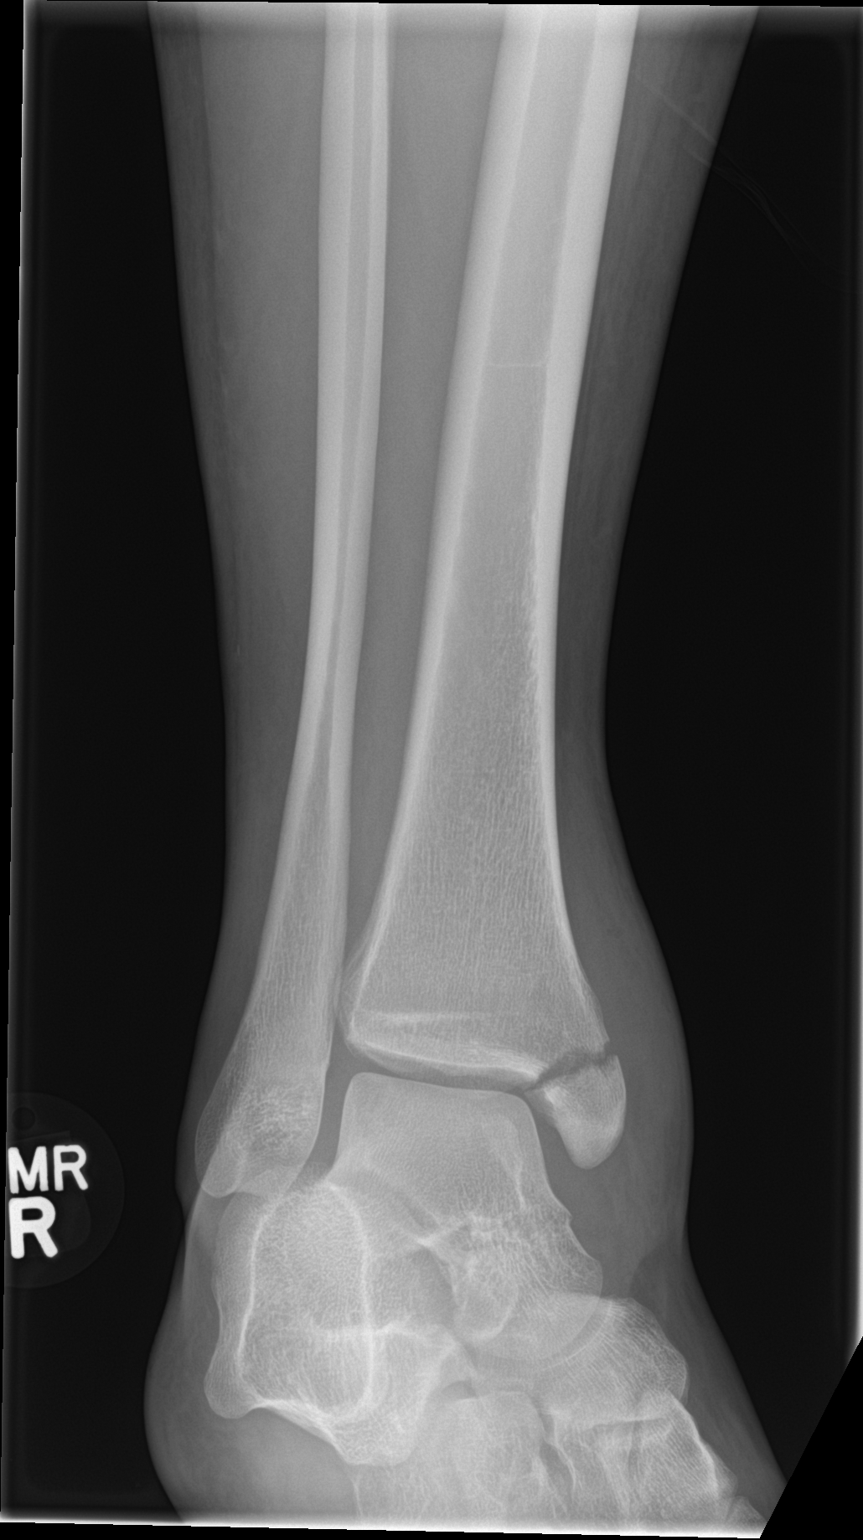

[ankle lat]
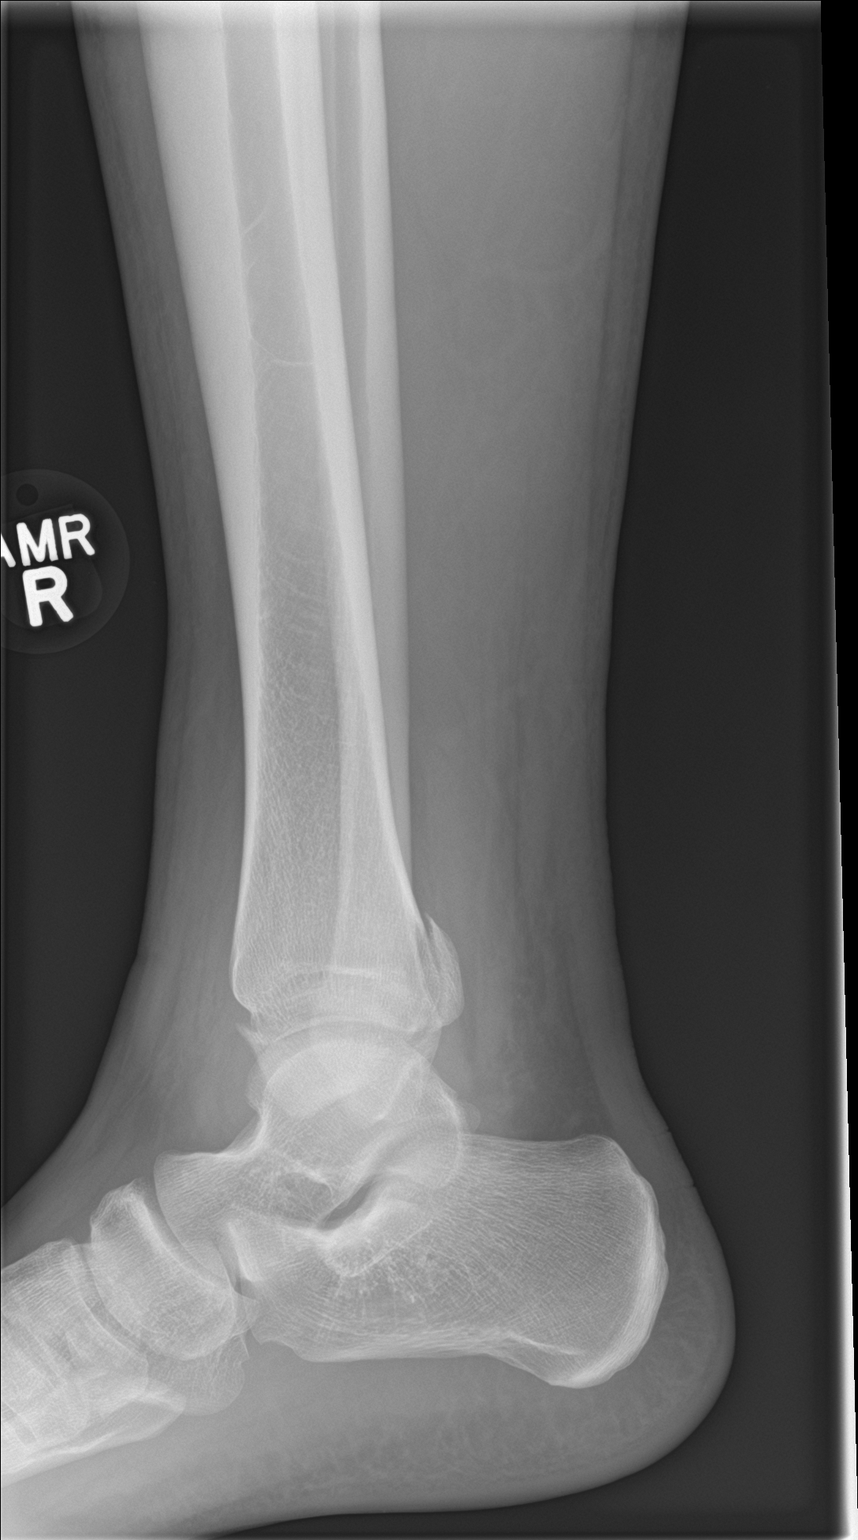

[3 of 3 positions shown; findings below may reference images not displayed]

FINDINGS: No fracture or dislocation of the proximal tibia or fibula. The
partially included right knee is unremarkable.

There is a mildly displaced transverse fracture of the right medial
malleolus, a mildly displaced coronal plane fracture of the
posterior malleolus, and mild widening of the distal tibiofibular
interval and ankle mortise. The distal fibula is intact. Extensive
soft tissue edema about the ankle.

No fracture or dislocation of the right foot. Joint spaces are
preserved.
IMPRESSION: 1. No fracture or dislocation of the proximal tibia or fibula. The
partially included right knee is unremarkable.

2. There is a mildly displaced transverse fracture of the right
medial malleolus, a mildly displaced coronal plane fracture of the
posterior malleolus, and mild widening of the distal tibiofibular
interval and ankle mortise. The distal fibula is intact. Extensive
soft tissue edema about the ankle.

3. No fracture or dislocation of the right foot. Joint spaces are
preserved.

## 2024-01-14 ENCOUNTER — Other Ambulatory Visit: Payer: Self-pay

## 2024-01-14 DIAGNOSIS — K649 Unspecified hemorrhoids: Secondary | ICD-10-CM | POA: Insufficient documentation

## 2024-01-14 NOTE — ED Triage Notes (Addendum)
Pt to ed from home via POV for hemorrhoids. Pt is caox4, in no acute distress and ambulatory in triage. Pt has had them for over a year. But in the last to an hour it became worse. He went to try to have a BM and he wasn't able to do so since they hurt so bad. Pt is unable to sit down. Pt has not used any OTC meds to alleviate the pain.

## 2024-01-14 NOTE — ED Provider Triage Note (Signed)
Emergency Medicine Provider Triage Evaluation Note  Joe [name] , a 33 y.o. male  was evaluated in triage.  Pt complains of very painful hemorrhoid. Symptoms started this afternoon. No OTC medications.  Physical Exam  There were no vitals taken for this visit. Gen:   Awake, no distress   Resp:  Normal effort  MSK:   Moves extremities without difficulty  Other:    Medical Decision Making  Medically screening exam initiated at 9:48 PM.  Appropriate orders placed.  Joe [name] was informed that the remainder of the evaluation will be completed by another provider, this initial triage assessment does not replace that evaluation, and the importance of remaining in the ED until their evaluation is complete.    Chinita Pester, FNP 01/14/24 2215

## 2024-01-15 ENCOUNTER — Emergency Department
Admission: EM | Admit: 2024-01-15 | Discharge: 2024-01-15 | Disposition: A | Discharge status: Home / Self Care | Payer: Self-pay | Attending: Emergency Medicine | Admitting: Emergency Medicine

## 2024-01-15 DIAGNOSIS — K649 Unspecified hemorrhoids: Secondary | ICD-10-CM

## 2024-01-15 MED ORDER — OXYCODONE-ACETAMINOPHEN 5-325 MG PO TABS
1.0000 | ORAL_TABLET | Freq: Once | ORAL | Status: AC
  Administered 2024-01-15: 1 via ORAL
  Filled 2024-01-15: qty 1

## 2024-01-15 MED ORDER — DOCUSATE SODIUM 100 MG PO CAPS: 100.0000 mg | ORAL_CAPSULE | Freq: Two times a day (BID) | ORAL | 0 refills | Status: AC

## 2024-01-15 MED ORDER — HYDROCORTISONE ACETATE 25 MG RE SUPP: 25.0000 mg | Freq: Two times a day (BID) | RECTAL | 1 refills | Status: AC

## 2024-01-15 MED ORDER — LIDOCAINE HCL URETHRAL/MUCOSAL 2 % EX GEL
1.0000 | Freq: Once | CUTANEOUS | Status: AC
  Administered 2024-01-15: 1 via TOPICAL
  Filled 2024-01-15: qty 6

## 2024-01-15 NOTE — ED Provider Notes (Signed)
[facility] Provider Note    Event Date/Time   First MD Initiated Contact with Patient 01/15/24 [phone]     (approximate)   History   Chief Complaint Hemorrhoids   HPI  Joe [name] is a 33 y.o. male with no significant past medical history who presents to the ED complaining of hemorrhoids.  Patient reports that he has been dealing with increasing pain around his rectal area since earlier this evening.  He has also noticed what feels like a small bulge around his rectal area, which is especially painful when he goes to have a bowel movement.  He has not noticed any bleeding and denies any abdominal pain, nausea, or vomiting.  He has not taken anything for his symptoms prior to arrival.     Physical Exam   Triage Vital Signs: ED Triage Vitals  Encounter Vitals Group     BP 01/14/24 2150 128/89     Systolic BP Percentile --      Diastolic BP Percentile --      Pulse Rate 01/14/24 2150 95     Resp 01/14/24 2150 16     Temp 01/14/24 2150 98 F (36.7 C)     Temp Source 01/14/24 2150 Oral     SpO2 01/14/24 2150 98 %     Weight --      Height --      Head Circumference --      Peak Flow --      Pain Score 01/14/24 2148 10     Pain Loc --      Pain Education --      Exclude from Growth Chart --     Most recent vital signs: Vitals:   01/14/24 2150 01/15/24 0155  BP: 128/89 121/74  Pulse: 95 71  Resp: 16 18  Temp: 98 F (36.7 C) 97.7 F (36.5 C)  SpO2: 98% 100%    Constitutional: Alert and oriented. Eyes: Conjunctivae are normal. Head: Atraumatic. Nose: No congestion/rhinnorhea. Mouth/Throat: Mucous membranes are moist.  Cardiovascular: Normal rate, regular rhythm. Grossly normal heart sounds.  2+ radial pulses bilaterally. Respiratory: Normal respiratory effort.  No retractions. Lungs CTAB. Gastrointestinal: Soft and nontender. No distention.  Rectal exam with large external hemorrhoid, no bleeding noted. Musculoskeletal: No lower  extremity tenderness nor edema.  Neurologic:  Normal speech and language. No gross focal neurologic deficits are appreciated.    ED Results / Procedures / Treatments   Labs (all labs ordered are listed, but only abnormal results are displayed) Labs Reviewed - No data to display   PROCEDURES:  Critical Care performed: No  Procedures   MEDICATIONS ORDERED IN ED: Medications  oxyCODONE-acetaminophen (PERCOCET/ROXICET) 5-325 MG per tablet 1 tablet (1 tablet Oral Given 01/15/24 0300)  lidocaine (XYLOCAINE) 2 % jelly 1 Application (1 Application Topical Given 01/15/24 0258)     IMPRESSION / MDM / ASSESSMENT AND PLAN / ED COURSE  I reviewed the triage vital signs and the nursing notes.                              33 y.o. male with no significant past medical history who presents to the ED complaining of increasing pain in his rectal area over the course of the day today.  Patient's presentation is most consistent with acute, uncomplicated illness.  Differential diagnosis includes, but is not limited to, hemorrhoids, anal fissure, constipation.  Patient nontoxic-appearing and in no  acute distress, vital signs are unremarkable.  His abdominal exam is benign, rectal exam with large external hemorrhoid that is likely the source of his pain.  We will treat with dose of Percocet and topical lidocaine.  Pain significantly improved on reassessment and patient was able to have a normal bowel movement.  He is appropriate for discharge home with outpatient surgery follow-up, was counseled to return to the ED for new or worsening symptoms.  Patient agrees with plan.      FINAL CLINICAL IMPRESSION(S) / ED DIAGNOSES   Final diagnoses:  Hemorrhoids, unspecified hemorrhoid type     Rx / DC Orders   ED Discharge Orders          Ordered    hydrocortisone (ANUSOL-HC) 25 MG suppository  Every 12 hours        01/15/24 0355    docusate sodium (COLACE) 100 MG capsule  2 times daily         01/15/24 0355             Note:  This document was prepared using Dragon voice recognition software and may include unintentional dictation errors.   Chesley Noon, MD 01/15/24 [phone]

## 2024-06-09 ENCOUNTER — Emergency Department: Payer: Self-pay

## 2024-06-09 ENCOUNTER — Emergency Department
Admission: EM | Admit: 2024-06-09 | Discharge: 2024-06-09 | Disposition: A | Discharge status: Home / Self Care | Payer: Self-pay | Attending: Emergency Medicine | Admitting: Emergency Medicine

## 2024-06-09 ENCOUNTER — Other Ambulatory Visit: Payer: Self-pay

## 2024-06-09 DIAGNOSIS — R109 Unspecified abdominal pain: Secondary | ICD-10-CM

## 2024-06-09 DIAGNOSIS — R1032 Left lower quadrant pain: Secondary | ICD-10-CM | POA: Insufficient documentation

## 2024-06-09 DIAGNOSIS — R110 Nausea: Secondary | ICD-10-CM | POA: Insufficient documentation

## 2024-06-09 LAB — CBC
HCT: 44.7 % (ref 39.0–52.0)
Hemoglobin: 15.2 g/dL (ref 13.0–17.0)
MCH: 29.9 pg (ref 26.0–34.0)
MCHC: 34 g/dL (ref 30.0–36.0)
MCV: 88 fL (ref 80.0–100.0)
Platelets: 206 10*3/uL (ref 150–400)
RBC: 5.08 MIL/uL (ref 4.22–5.81)
RDW: 12.2 % (ref 11.5–15.5)
WBC: 7 10*3/uL (ref 4.0–10.5)
nRBC: 0 % (ref 0.0–0.2)

## 2024-06-09 LAB — COMPREHENSIVE METABOLIC PANEL WITH GFR
ALT: 17 U/L (ref 0–44)
AST: 20 U/L (ref 15–41)
Albumin: 4.6 g/dL (ref 3.5–5.0)
Alkaline Phosphatase: 48 U/L (ref 38–126)
Anion gap: 11 (ref 5–15)
BUN: 14 mg/dL (ref 6–20)
CO2: 25 mmol/L (ref 22–32)
Calcium: 9.2 mg/dL (ref 8.9–10.3)
Chloride: 102 mmol/L (ref 98–111)
Creatinine, Ser: 1.02 mg/dL (ref 0.61–1.24)
GFR, Estimated: >60 mL/min (ref >60)
Glucose, Bld: 112 mg/dL — ABNORMAL HIGH (ref 70–99)
Potassium: 3.6 mmol/L (ref 3.5–5.1)
Sodium: 138 mmol/L (ref 135–145)
Total Bilirubin: 1.2 mg/dL (ref 0.0–1.2)
Total Protein: 7.8 g/dL (ref 6.5–8.1)

## 2024-06-09 LAB — URINALYSIS, ROUTINE W REFLEX MICROSCOPIC
Bilirubin Urine: NEGATIVE
Glucose, UA: NEGATIVE mg/dL
Hgb urine dipstick: NEGATIVE
Ketones, ur: NEGATIVE mg/dL
Leukocytes,Ua: NEGATIVE
Nitrite: NEGATIVE
Protein, ur: NEGATIVE mg/dL
Specific Gravity, Urine: 1.026 (ref 1.005–1.030)
pH: 5 (ref 5.0–8.0)

## 2024-06-09 LAB — LIPASE, BLOOD: Lipase: 37 U/L (ref 11–51)

## 2024-06-09 MED ORDER — ONDANSETRON HCL 4 MG/2ML IJ SOLN
4.0000 mg | Freq: Once | INTRAMUSCULAR | Status: AC
  Administered 2024-06-09: 4 mg via INTRAVENOUS
  Filled 2024-06-09: qty 2

## 2024-06-09 MED ORDER — KETOROLAC TROMETHAMINE 30 MG/ML IJ SOLN
30.0000 mg | Freq: Once | INTRAMUSCULAR | Status: AC
  Administered 2024-06-09: 30 mg via INTRAVENOUS
  Filled 2024-06-09: qty 1

## 2024-06-09 NOTE — ED Provider Notes (Signed)
 [facility] Provider Note    Event Date/Time   First MD Initiated Contact with Patient 06/09/24 [phone]     (approximate)   History   Abdominal Pain   HPI  DELFORD WINGERT is a 33 y.o. male with no significant past medical history presents with complaints of abrupt onset left lower quadrant/flank abdominal pain.  He reports the pain was severe, he did have nausea.  No history of kidney stones, no dysuria.  No history of abdominal surgery     Physical Exam   Triage Vital Signs: ED Triage Vitals  Encounter Vitals Group     BP 06/09/24 0918 (!) 160/69     Girls Systolic BP Percentile --      Girls Diastolic BP Percentile --      Boys Systolic BP Percentile --      Boys Diastolic BP Percentile --      Pulse Rate 06/09/24 0918 (!) 106     Resp 06/09/24 0918 17     Temp 06/09/24 0918 97.7 F (36.5 C)     Temp src --      SpO2 06/09/24 0918 100 %     Weight 06/09/24 0919 61.2 kg (135 lb)     Height 06/09/24 0919 1.676 m (5' 6)     Head Circumference --      Peak Flow --      Pain Score 06/09/24 0919 10     Pain Loc --      Pain Education --      Exclude from Growth Chart --     Most recent vital signs: Vitals:   06/09/24 0918 06/09/24 0946  BP: (!) 160/69   Pulse: (!) 106   Resp: 17   Temp: 97.7 F (36.5 C)   SpO2: 100% 100%     General: Awake,  CV:  Good peripheral perfusion.  Resp:  Normal effort.  Abd:  No distention.  Soft, nontender, no CVA tenderness Other:     ED Results / Procedures / Treatments   Labs (all labs ordered are listed, but only abnormal results are displayed) Labs Reviewed  COMPREHENSIVE METABOLIC PANEL WITH GFR - Abnormal; Notable for the following components:      Result Value   Glucose, Bld 112 (*)    All other components within normal limits  URINALYSIS, ROUTINE W REFLEX MICROSCOPIC - Abnormal; Notable for the following components:   Color, Urine YELLOW (*)    APPearance CLEAR (*)    All other  components within normal limits  LIPASE, BLOOD  CBC     EKG     RADIOLOGY CT renal stone send pending    PROCEDURES:  Critical Care performed:   Procedures   MEDICATIONS ORDERED IN ED: Medications  ketorolac  (TORADOL ) 30 MG/ML injection 30 mg (30 mg Intravenous Given 06/09/24 1003)  ondansetron  (ZOFRAN ) injection 4 mg (4 mg Intravenous Given 06/09/24 1003)     IMPRESSION / MDM / ASSESSMENT AND PLAN / ED COURSE  I reviewed the triage vital signs and the nursing notes. Patient's presentation is most consistent with acute presentation with potential threat to life or bodily function.  Patient presents with abdominal pain as above, suspicious for ureterolithiasis, differential includes diverticulitis, colitis.  Will treat with IV Toradol , IV Zofran , obtain CT renal stone study, labs, urinalysis and reevaluate.  CT scan is negative for acute abnormality, patient's pain has entirely resolved, question possible passed kidney stone.  Discussed with him lab and CT  results, close return precautions no indication for admission at this time, appropriate for discharge      FINAL CLINICAL IMPRESSION(S) / ED DIAGNOSES   Final diagnoses:  Acute flank pain     Rx / DC Orders   ED Discharge Orders     None        Note:  This document was prepared using Dragon voice recognition software and may include unintentional dictation errors.   Bryson Carbine, MD 06/09/24 [phone]

## 2024-06-09 NOTE — ED Triage Notes (Signed)
 Pt comes in via pov with complaints of LLQ abdominal pain that started about 15 minutes ago. Pt complains of nausea but no vomiting. Pt states that the pain comes and goes. Pt with no other signs of acute distress at this time.

## 2024-06-09 NOTE — ED Notes (Signed)
 Pt unable to provide urin sample at this time as he just urinated 30 mins ago, Pt given specimen cup and this RN will attempt again.

## 2024-09-06 ENCOUNTER — Emergency Department
Admission: EM | Admit: 2024-09-06 | Discharge: 2024-09-07 | Disposition: A | Discharge status: Home / Self Care | Payer: Self-pay | Attending: Emergency Medicine | Admitting: Emergency Medicine

## 2024-09-06 ENCOUNTER — Other Ambulatory Visit: Payer: Self-pay

## 2024-09-06 DIAGNOSIS — R1031 Right lower quadrant pain: Secondary | ICD-10-CM | POA: Insufficient documentation

## 2024-09-06 DIAGNOSIS — R112 Nausea with vomiting, unspecified: Secondary | ICD-10-CM | POA: Insufficient documentation

## 2024-09-06 DIAGNOSIS — E162 Hypoglycemia, unspecified: Secondary | ICD-10-CM | POA: Insufficient documentation

## 2024-09-06 NOTE — ED Triage Notes (Signed)
 Pt reports right side groin pain that began just PTA, pt reports he has  hx kidney stones. Pt denies dysuria or hematuria

## 2024-09-07 ENCOUNTER — Emergency Department: Payer: Self-pay

## 2024-09-07 LAB — CBC WITH DIFFERENTIAL/PLATELET
Abs Immature Granulocytes: 0.03 K/uL (ref 0.00–0.07)
Basophils Absolute: 0.1 K/uL (ref 0.0–0.1)
Basophils Relative: 1 %
Eosinophils Absolute: 0.3 K/uL (ref 0.0–0.5)
Eosinophils Relative: 3 %
HCT: 43.3 % (ref 39.0–52.0)
Hemoglobin: 15.1 g/dL (ref 13.0–17.0)
Immature Granulocytes: 0 %
Lymphocytes Relative: 41 %
Lymphs Abs: 4.5 K/uL — ABNORMAL HIGH (ref 0.7–4.0)
MCH: 30.4 pg (ref 26.0–34.0)
MCHC: 34.9 g/dL (ref 30.0–36.0)
MCV: 87.3 fL (ref 80.0–100.0)
Monocytes Absolute: 1.1 K/uL — ABNORMAL HIGH (ref 0.1–1.0)
Monocytes Relative: 10 %
Neutro Abs: 5 K/uL (ref 1.7–7.7)
Neutrophils Relative %: 45 %
Platelets: 237 K/uL (ref 150–400)
RBC: 4.96 MIL/uL (ref 4.22–5.81)
RDW: 12.4 % (ref 11.5–15.5)
WBC: 11.1 K/uL — ABNORMAL HIGH (ref 4.0–10.5)
nRBC: 0 % (ref 0.0–0.2)

## 2024-09-07 LAB — URINALYSIS, ROUTINE W REFLEX MICROSCOPIC
Bilirubin Urine: NEGATIVE
Glucose, UA: NEGATIVE mg/dL
Hgb urine dipstick: NEGATIVE
Ketones, ur: NEGATIVE mg/dL
Leukocytes,Ua: NEGATIVE
Nitrite: NEGATIVE
Protein, ur: NEGATIVE mg/dL
Specific Gravity, Urine: 1.024 (ref 1.005–1.030)
pH: 5 (ref 5.0–8.0)

## 2024-09-07 LAB — BASIC METABOLIC PANEL WITH GFR
Anion gap: 13 (ref 5–15)
BUN: 18 mg/dL (ref 6–20)
CO2: 25 mmol/L (ref 22–32)
Calcium: 9.3 mg/dL (ref 8.9–10.3)
Chloride: 102 mmol/L (ref 98–111)
Creatinine, Ser: 1.36 mg/dL — ABNORMAL HIGH (ref 0.61–1.24)
GFR, Estimated: >60 mL/min (ref >60)
Glucose, Bld: 56 mg/dL — ABNORMAL LOW (ref 70–99)
Potassium: 3.3 mmol/L — ABNORMAL LOW (ref 3.5–5.1)
Sodium: 140 mmol/L (ref 135–145)

## 2024-09-07 LAB — CBG MONITORING, ED: Glucose-Capillary: 103 mg/dL — ABNORMAL HIGH (ref 70–99)

## 2024-09-07 LAB — CHLAMYDIA/NGC RT PCR (ARMC ONLY)
Chlamydia Tr: NOT DETECTED
N gonorrhoeae: NOT DETECTED

## 2024-09-07 MED ORDER — KETOROLAC TROMETHAMINE 30 MG/ML IJ SOLN
30.0000 mg | Freq: Once | INTRAMUSCULAR | Status: AC
  Administered 2024-09-07: 30 mg via INTRAVENOUS
  Filled 2024-09-07: qty 1

## 2024-09-07 MED ORDER — ONDANSETRON 4 MG PO TBDP
4.0000 mg | ORAL_TABLET | Freq: Four times a day (QID) | ORAL | 0 refills | Status: DC | PRN
Start: 2024-09-07 — End: 2024-09-18

## 2024-09-07 MED ORDER — SODIUM CHLORIDE 0.9 % IV BOLUS (SEPSIS)
1000.0000 mL | Freq: Once | INTRAVENOUS | Status: AC
  Administered 2024-09-07: 1000 mL via INTRAVENOUS

## 2024-09-07 MED ORDER — DICYCLOMINE HCL 20 MG PO TABS: 20.0000 mg | ORAL_TABLET | Freq: Three times a day (TID) | ORAL | 0 refills | Status: DC | PRN

## 2024-09-07 MED ORDER — ONDANSETRON HCL 4 MG/2ML IJ SOLN
4.0000 mg | Freq: Once | INTRAMUSCULAR | Status: AC
  Administered 2024-09-07: 4 mg via INTRAVENOUS
  Filled 2024-09-07: qty 2

## 2024-09-07 MED ORDER — DICYCLOMINE HCL 20 MG PO TABS: 20.0000 mg | ORAL_TABLET | Freq: Three times a day (TID) | ORAL | 0 refills | Status: AC | PRN

## 2024-09-07 MED ORDER — MORPHINE SULFATE (PF) 4 MG/ML IV SOLN
4.0000 mg | Freq: Once | INTRAVENOUS | Status: AC
  Administered 2024-09-07: 4 mg via INTRAVENOUS
  Filled 2024-09-07: qty 1

## 2024-09-07 MED ORDER — ONDANSETRON 4 MG PO TBDP
4.0000 mg | ORAL_TABLET | Freq: Four times a day (QID) | ORAL | 0 refills | Status: DC | PRN
Start: 2024-09-07 — End: 2024-09-07

## 2024-09-07 MED ORDER — DEXTROSE 50 % IV SOLN
25.0000 mL | Freq: Once | INTRAVENOUS | Status: AC
  Administered 2024-09-07: 25 mL via INTRAVENOUS
  Filled 2024-09-07: qty 50

## 2024-09-07 NOTE — ED Notes (Signed)
 Patient to CT.

## 2024-09-07 NOTE — Discharge Instructions (Addendum)
 Your urine showed no sign of infection today.  The scan of your abdomen showed no kidney stone and a normal appendix.  Ultrasound of your right lobe was also normal.  You could have a viral illness causing your symptoms.  No sign of any life-threatening abnormality currently.  You may alternate over the counter Tylenol  1000 mg every 6 hours as needed for pain, fever and Ibuprofen 800 mg every 6-8 hours as needed for pain, fever.  Please take Ibuprofen with food.  Do not take more than 4000 mg of Tylenol  (acetaminophen ) in a 24 hour period.

## 2024-09-07 NOTE — ED Notes (Signed)
Pt provided w/water and crackers for PO challenge.

## 2024-09-07 NOTE — ED Notes (Signed)
 Po challenge successful.

## 2024-09-07 NOTE — ED Notes (Signed)
 IV removed.  Pt left ambulatory in no distress

## 2024-09-07 NOTE — ED Provider Notes (Signed)
 [facility] Provider Note    Event Date/Time   First MD Initiated Contact with Patient 09/06/24 2356     (approximate)   History   Groin Pain   HPI  Joe [name] is a 33 y.o. male with history of atrial fibrillation, sleep apnea who presents to the emergency department with complaints of right lower abdominal pain, nausea and vomiting that started prior to arrival.  No diarrhea, fever, dysuria or hematuria, penile discharge, testicular pain or swelling, no scrotal abnormality.  He states he has been told that he may have passed a kidney stone before.  Still having pain currently.   History provided by patient and wife.    Past Medical History:  Diagnosis Date   Paroxysmal atrial fibrillation (HCC)    Sleep apnea    mild and no CPAP prescribed    Past Surgical History:  Procedure Laterality Date   FOREIGN BODY REMOVAL Left 04/29/2017   Procedure: REMOVAL FOREIGN BODY EXTREMITY and wound exploration;  Surgeon: Franky Curia, MD;  Location: [facility] OR;  Service: Orthopedics;  Laterality: Left;   ORIF ANKLE FRACTURE Right 08/23/2019   Procedure: OPEN REDUCTION INTERNAL FIXATION (ORIF) ANKLE FRACTURE;  Surgeon: Cristy Bonner DASEN, MD;  Location: [facility];  Service: Orthopedics;  Laterality: Right;   SYNDESMOSIS REPAIR Right 08/23/2019   Procedure: OPEN REDUCTION INTERNAL FIXATION  OF SYNDESMOSIS REPAIR;  Surgeon: Cristy Bonner DASEN, MD;  Location: [facility];  Service: Orthopedics;  Laterality: Right;    MEDICATIONS:  Prior to Admission medications   Medication Sig Start Date End Date Taking? Authorizing Provider  famotidine (PEPCID) 10 MG tablet Take 10 mg by mouth 2 (two) times daily.    [provider]  hydrocortisone  (ANUSOL -HC) 25 MG suppository Place 1 suppository (25 mg total) rectally every 12 (twelve) hours. 01/15/24 01/14/25  Willo Dunnings, MD    Physical Exam   Triage Vital Signs: ED Triage Vitals  Encounter  Vitals Group     BP 09/06/24 2351 (!) 158/107     Girls Systolic BP Percentile --      Girls Diastolic BP Percentile --      Boys Systolic BP Percentile --      Boys Diastolic BP Percentile --      Pulse Rate 09/06/24 2351 (!) 119     Resp 09/06/24 2351 18     Temp 09/06/24 2351 97.8 F (36.6 C)     Temp Source 09/06/24 2351 Oral     SpO2 09/06/24 2351 97 %     Weight 09/06/24 2351 135 lb (61.2 kg)     Height 09/06/24 2351 5' 6 (1.676 m)     Head Circumference --      Peak Flow --      Pain Score 09/06/24 2350 8     Pain Loc --      Pain Education --      Exclude from Growth Chart --     Most recent vital signs: Vitals:   09/07/24 0213 09/07/24 0224  BP: (!) 106/51   Pulse: 85   Resp: 18   Temp: 98.1 F (36.7 C)   SpO2: 97% 97%    CONSTITUTIONAL: Alert, responds appropriately to questions.  Appears uncomfortable HEAD: Normocephalic, atraumatic EYES: Conjunctivae clear, pupils appear equal, sclera nonicteric ENT: normal nose; moist mucous membranes NECK: Supple, normal ROM CARD: RRR; S1 and S2 appreciated RESP: Normal chest excursion without splinting or tachypnea; breath sounds clear and equal  bilaterally; no wheezes, no rhonchi, no rales, no hypoxia or respiratory distress, speaking full sentences ABD/GI: Non-distended; soft, mildly tender in the right pelvic area just below McBurney's point, no guarding or rebound BACK: The back appears normal EXT: Normal ROM in all joints; no deformity noted, no edema SKIN: Normal color for age and race; warm; no rash on exposed skin NEURO: Moves all extremities equally, normal speech PSYCH: The patient's mood and manner are appropriate.   ED Results / Procedures / Treatments   LABS: (all labs ordered are listed, but only abnormal results are displayed) Labs Reviewed  CBC WITH DIFFERENTIAL/PLATELET - Abnormal; Notable for the following components:      Result Value   WBC 11.1 (*)    Lymphs Abs 4.5 (*)    Monocytes Absolute  1.1 (*)    All other components within normal limits  BASIC METABOLIC PANEL WITH GFR - Abnormal; Notable for the following components:   Potassium 3.3 (*)    Glucose, Bld 56 (*)    Creatinine, Ser 1.36 (*)    All other components within normal limits  URINALYSIS, ROUTINE W REFLEX MICROSCOPIC - Abnormal; Notable for the following components:   Color, Urine YELLOW (*)    APPearance CLEAR (*)    All other components within normal limits  CBG MONITORING, ED - Abnormal; Notable for the following components:   Glucose-Capillary 103 (*)    All other components within normal limits  CHLAMYDIA/NGC RT PCR ([facility] ONLY)               EKG:  EKG Interpretation Date/Time:    Ventricular Rate:    PR Interval:    QRS Duration:    QT Interval:    QTC Calculation:   R Axis:      Text Interpretation:           RADIOLOGY: My personal review and interpretation of imaging: CT scan shows normal appendix.  No kidney stone.  Ultrasound shows no torsion.  I have personally reviewed all radiology reports.   US  SCROTUM W/DOPPLER Result Date: 09/07/2024 CLINICAL DATA:  Right testicular pain EXAM: SCROTAL ULTRASOUND DOPPLER ULTRASOUND OF THE TESTICLES TECHNIQUE: Complete ultrasound examination of the testicles, epididymis, and other scrotal structures was performed. Color and spectral Doppler ultrasound were also utilized to evaluate blood flow to the testicles. COMPARISON:  None Available. FINDINGS: Right testicle Measurements: 5.4 x 2.1 x 3.3 cm. No mass or microlithiasis visualized. Left testicle Measurements: 4.8 x 2.0 x 3.1 cm. No mass or microlithiasis visualized. Right epididymis:  Normal in size and appearance. Left epididymis:  5 mm cyst in the epididymal head. Hydrocele:  None visualized. Varicocele:  None visualized. Pulsed Doppler interrogation of both testes demonstrates normal low resistance arterial and venous waveforms bilaterally. IMPRESSION: No testicular abnormality or evidence of torsion.  No acute findings. Electronically Signed   By: Franky Crease M.D.   On: 09/07/2024 01:40   CT Renal Stone Study Result Date: 09/07/2024 CLINICAL DATA:  Abdominal pain, flank pain EXAM: CT ABDOMEN AND PELVIS WITHOUT CONTRAST TECHNIQUE: Multidetector CT imaging of the abdomen and pelvis was performed following the standard protocol without IV contrast. RADIATION DOSE REDUCTION: This exam was performed according to the departmental dose-optimization program which includes automated exposure control, adjustment of the mA and/or kV according to patient size and/or use of iterative reconstruction technique. COMPARISON:  06/09/2024 FINDINGS: Lower chest: No acute abnormality Hepatobiliary: No focal hepatic abnormality. Gallbladder unremarkable. Pancreas: No focal abnormality or ductal dilatation. Spleen: No  focal abnormality.  Normal size. Adrenals/Urinary Tract: No adrenal abnormality. No focal renal abnormality. No stones or hydronephrosis. Urinary bladder is unremarkable. Stomach/Bowel: Normal appendix. Stomach, large and small bowel grossly unremarkable. Vascular/Lymphatic: No evidence of aneurysm or adenopathy. Reproductive: No visible focal abnormality. Other: No free fluid or free air. Musculoskeletal: No acute bony abnormality. IMPRESSION: No acute findings in the abdomen or pelvis. Electronically Signed   By: Franky Crease M.D.   On: 09/07/2024 00:14     PROCEDURES:  Critical Care performed: No    Procedures    IMPRESSION / MDM / ASSESSMENT AND PLAN / ED COURSE  I reviewed the triage vital signs and the nursing notes.    Patient here with right lower quadrant abdominal pain, nausea and vomiting.  The patient is on the cardiac monitor to evaluate for evidence of arrhythmia and/or significant heart rate changes.   DIFFERENTIAL DIAGNOSIS (includes but not limited to):   Kidney stone, UTI, pyelonephritis, appendicitis, testicular torsion, no signs of scrotal abscess or scrotal  cellulitis   Patient's presentation is most consistent with acute presentation with potential threat to life or bodily function.   PLAN: Will obtain labs, urine, CT renal study.  Will give IV fluids, pain and nausea medicine.   MEDICATIONS GIVEN IN ED: Medications  morphine  (PF) 4 MG/ML injection 4 mg (4 mg Intravenous Given 09/07/24 0023)  ketorolac  (TORADOL ) 30 MG/ML injection 30 mg (30 mg Intravenous Given 09/07/24 0023)  sodium chloride  0.9 % bolus 1,000 mL (0 mLs Intravenous Stopped 09/07/24 0224)  ondansetron  (ZOFRAN ) injection 4 mg (4 mg Intravenous Given 09/07/24 0023)  dextrose  50 % solution 25 mL (25 mLs Intravenous Given 09/07/24 0044)     ED COURSE: Labs show slight leukocytosis.  Normal creatinine, electrolytes.  Urine does not appear infected.  CT scan reviewed and interpreted by myself and the radiologist shows no ureteral stones, hydronephrosis.  Normal appendix.  He does not have any testicular tenderness but will obtain scrotal Doppler to rule out torsion causing referred pain.  Doubt epididymitis, orchitis.  Gonorrhea and Chlamydia test are pending.   Ultrasound reviewed and interpreted by myself and the radiologist and shows no torsion.  Gonorrhea and Chlamydia negative.  He is feeling better, tolerating p.o. and vital signs have improved.  Discussed with patient that this could be from a viral gastroenteritis.  No sign of any life-threatening illness present today or surgical issue.  I feel he is safe for discharge.  Will discharge with instructions to alternate Tylenol , Motrin, prescriptions for Bentyl  and Zofran .  Discussed return precautions and recommended bland diet for the next several days.  The patient and wife are comfortable with this plan.   Also patient's labs did show blood glucose in the 50s.  He denies history of diabetes and is not on any medications for blood sugar or weight loss and states he did eat just prior to arrival.  Given D50 and this improved.  At  this time, I do not feel there is any life-threatening condition present. I reviewed all nursing notes, vitals, pertinent previous records.  All lab and urine results, EKGs, imaging ordered have been independently reviewed and interpreted by myself.  I reviewed all available radiology reports from any imaging ordered this visit.  Based on my assessment, I feel the patient is safe to be discharged home without further emergent workup and can continue workup as an outpatient as needed. Discussed all findings, treatment plan as well as usual and customary return precautions.  They verbalize understanding and are comfortable with this plan.  Outpatient follow-up has been provided as needed.  All questions have been answered.    CONSULTS:  none   OUTSIDE RECORDS REVIEWED: Reviewed last orthopedic notes in 2020.       FINAL CLINICAL IMPRESSION(S) / ED DIAGNOSES   Final diagnoses:  Nausea and vomiting in adult  Hypoglycemia  Abdominal pain, RLQ     Rx / DC Orders   ED Discharge Orders          Ordered    ondansetron  (ZOFRAN -ODT) 4 MG disintegrating tablet  Every 6 hours PRN,   Status:  Discontinued        09/07/24 0149    dicyclomine  (BENTYL ) 20 MG tablet  Every 8 hours PRN,   Status:  Discontinued        09/07/24 0149    dicyclomine  (BENTYL ) 20 MG tablet  Every 8 hours PRN        09/07/24 0306    ondansetron  (ZOFRAN -ODT) 4 MG disintegrating tablet  Every 6 hours PRN        09/07/24 0306             Note:  This document was prepared using Dragon voice recognition software and may include unintentional dictation errors.   Navin Dogan, Josette SAILOR, DO 09/07/24 [phone]

## 2024-09-18 ENCOUNTER — Emergency Department
Admission: EM | Admit: 2024-09-18 | Discharge: 2024-09-18 | Disposition: A | Discharge status: Home / Self Care | Payer: Self-pay | Attending: Emergency Medicine | Admitting: Emergency Medicine

## 2024-09-18 ENCOUNTER — Other Ambulatory Visit: Payer: Self-pay

## 2024-09-18 DIAGNOSIS — R112 Nausea with vomiting, unspecified: Secondary | ICD-10-CM | POA: Insufficient documentation

## 2024-09-18 DIAGNOSIS — R1013 Epigastric pain: Secondary | ICD-10-CM | POA: Insufficient documentation

## 2024-09-18 DIAGNOSIS — R197 Diarrhea, unspecified: Secondary | ICD-10-CM | POA: Insufficient documentation

## 2024-09-18 LAB — COMPREHENSIVE METABOLIC PANEL WITH GFR
ALT: 19 U/L (ref 0–44)
AST: 18 U/L (ref 15–41)
Albumin: 4.6 g/dL (ref 3.5–5.0)
Alkaline Phosphatase: 56 U/L (ref 38–126)
Anion gap: 15 (ref 5–15)
BUN: 14 mg/dL (ref 6–20)
CO2: 25 mmol/L (ref 22–32)
Calcium: 9.6 mg/dL (ref 8.9–10.3)
Chloride: 100 mmol/L (ref 98–111)
Creatinine, Ser: 1.1 mg/dL (ref 0.61–1.24)
GFR, Estimated: >60 mL/min (ref >60)
Glucose, Bld: 111 mg/dL — ABNORMAL HIGH (ref 70–99)
Potassium: 4 mmol/L (ref 3.5–5.1)
Sodium: 140 mmol/L (ref 135–145)
Total Bilirubin: 0.9 mg/dL (ref 0.0–1.2)
Total Protein: 7.8 g/dL (ref 6.5–8.1)

## 2024-09-18 LAB — LIPASE, BLOOD: Lipase: 33 U/L (ref 11–51)

## 2024-09-18 LAB — URINALYSIS, ROUTINE W REFLEX MICROSCOPIC
Bacteria, UA: NONE SEEN
Bilirubin Urine: NEGATIVE
Glucose, UA: NEGATIVE mg/dL
Ketones, ur: NEGATIVE mg/dL
Leukocytes,Ua: NEGATIVE
Nitrite: NEGATIVE
Protein, ur: NEGATIVE mg/dL
Specific Gravity, Urine: 1.029 (ref 1.005–1.030)
pH: 6 (ref 5.0–8.0)

## 2024-09-18 LAB — CBC
HCT: 46 % (ref 39.0–52.0)
Hemoglobin: 15.6 g/dL (ref 13.0–17.0)
MCH: 30.2 pg (ref 26.0–34.0)
MCHC: 33.9 g/dL (ref 30.0–36.0)
MCV: 89.1 fL (ref 80.0–100.0)
Platelets: 197 K/uL (ref 150–400)
RBC: 5.16 MIL/uL (ref 4.22–5.81)
RDW: 12.1 % (ref 11.5–15.5)
WBC: 8.6 K/uL (ref 4.0–10.5)
nRBC: 0 % (ref 0.0–0.2)

## 2024-09-18 MED ORDER — ONDANSETRON 4 MG PO TBDP: 4.0000 mg | ORAL_TABLET | Freq: Three times a day (TID) | ORAL | 0 refills | Status: DC | PRN

## 2024-09-18 MED ORDER — FAMOTIDINE 20 MG PO TABS
20.0000 mg | ORAL_TABLET | Freq: Once | ORAL | Status: AC
  Administered 2024-09-18: 20 mg via ORAL
  Filled 2024-09-18: qty 1

## 2024-09-18 MED ORDER — FAMOTIDINE 20 MG PO TABS: 20.0000 mg | ORAL_TABLET | Freq: Every day | ORAL | 0 refills | Status: AC

## 2024-09-18 MED ORDER — ONDANSETRON 4 MG PO TBDP
4.0000 mg | ORAL_TABLET | Freq: Once | ORAL | Status: AC
  Administered 2024-09-18: 4 mg via ORAL
  Filled 2024-09-18: qty 1

## 2024-09-18 NOTE — ED Triage Notes (Signed)
 Pt comes with vomiting since Friday. Pt states some diarrhea but now has passed. Pt denies any belly pain. Pt states it all started at work and he hasn't been able to keep anything down.

## 2024-09-18 NOTE — Discharge Instructions (Addendum)
 You are seen in the emergency department for upper abdominal pain with nausea and vomiting.  Your lab work was normal.  You could have gastritis or peptic ulcer disease.  You were started on an acid reducer medication.  You are given information to follow-up as an outpatient with her primary care provider.  Return to the emergency department if you have any ongoing or worsening symptoms.  zofran  (ondansetron ) - nausea medication, take 1- 2 tablet every 8 hours as needed for nausea/vomiting.

## 2024-09-18 NOTE — ED Provider Notes (Signed)
 [facility] Provider Note    Event Date/Time   First MD Initiated Contact with Patient 09/18/24 0901     (approximate)   History   Emesis   HPI  Joe [name] is a 33 y.o. male no past medical history who presents to the emergency department with nausea and vomiting.  States that he has been having nausea and vomiting since this weekend.  Does not feel poorly but states that he is having difficulty keeping anything down.  Had some ODT Zofran  from her prior kidney stone and tried that at home this morning and states that he had ongoing nausea and vomiting.  Was having diarrhea but states that that has improved.  Denies any blood in his stool or melena.  No significant abdominal pain.  No fever or chills.  No dysuria, urinary urgency or frequency.  Denies any significant alcohol use.  Intermittently using synthetic marijuana.  No prior abdominal surgery.  Denies daily NSAID use.  No chest pain or shortness of breath.     Physical Exam   Triage Vital Signs: ED Triage Vitals  Encounter Vitals Group     BP 09/18/24 0851 (!) 137/91     Girls Systolic BP Percentile --      Girls Diastolic BP Percentile --      Boys Systolic BP Percentile --      Boys Diastolic BP Percentile --      Pulse Rate 09/18/24 0851 85     Resp 09/18/24 0851 18     Temp 09/18/24 0851 98 F (36.7 C)     Temp src --      SpO2 09/18/24 0851 99 %     Weight 09/18/24 0850 138 lb (62.6 kg)     Height 09/18/24 0850 5' 7 (1.702 m)     Head Circumference --      Peak Flow --      Pain Score 09/18/24 0850 0     Pain Loc --      Pain Education --      Exclude from Growth Chart --     Most recent vital signs: Vitals:   09/18/24 0851  BP: (!) 137/91  Pulse: 85  Resp: 18  Temp: 98 F (36.7 C)  SpO2: 99%    Physical Exam Constitutional:      Appearance: He is well-developed.  HENT:     Head: Atraumatic.  Eyes:     Extraocular Movements: Extraocular movements intact.      Conjunctiva/sclera: Conjunctivae normal.     Pupils: Pupils are equal, round, and reactive to light.  Cardiovascular:     Rate and Rhythm: Regular rhythm.  Pulmonary:     Effort: No respiratory distress.  Abdominal:     Tenderness: There is abdominal tenderness (Mild epigastric abdominal tenderness to palpation with no rebound or guarding.).  Musculoskeletal:        General: Normal range of motion.     Cervical back: Normal range of motion.     Right lower leg: No edema.     Left lower leg: No edema.  Skin:    General: Skin is warm.     Capillary Refill: Capillary refill takes less than 2 seconds.  Neurological:     Mental Status: He is alert. Mental status is at baseline.     IMPRESSION / MDM / ASSESSMENT AND PLAN / ED COURSE  I reviewed the triage vital signs and the nursing notes.  Differential diagnosis  including viral gastroenteritis, pancreatitis, cyclical vomiting, gastritis/PUD, anemia, dehydration.  Low suspicion for ACS, no chest pain, does have tenderness on abdominal exam, no risk factors    No tachycardic or bradycardic dysrhythmias while on cardiac telemetry.   (all labs ordered are listed, but only abnormal results are displayed) Labs interpreted as -    Labs Reviewed  COMPREHENSIVE METABOLIC PANEL WITH GFR - Abnormal; Notable for the following components:      Result Value   Glucose, Bld 111 (*)    All other components within normal limits  URINALYSIS, ROUTINE W REFLEX MICROSCOPIC - Abnormal; Notable for the following components:   Color, Urine YELLOW (*)    APPearance CLEAR (*)    Hgb urine dipstick SMALL (*)    All other components within normal limits  LIPASE, BLOOD  CBC     MDM  Patient given ODT Zofran  and Pepcid .  No significant leukocytosis on exam.  No anemia and platelets are his normal limits.  Lab work overall reassuring with no signs of pancreatitis.  Normal LFTs.  Low suspicion for acute cholecystitis or symptomatic  cholelithiasis.  Given ODT Zofran  and Pepcid   On reevaluation feeling much better.  Tolerating p.o.  Most likely with gastritis/PUD, possible cyclical vomiting syndrome.  Educated and encouraged to discontinue synthetic marijuana.  Will start on a PPI.  Given a prescription for ODT Zofran .  Discussed return for any worsening symptoms.  Repeat abdominal exam continues to be benign.     PROCEDURES:  Critical Care performed: No  Procedures  Patient's presentation is most consistent with acute presentation with potential threat to life or bodily function.   MEDICATIONS ORDERED IN ED: Medications  famotidine  (PEPCID ) tablet 20 mg (20 mg Oral Given 09/18/24 0925)  ondansetron  (ZOFRAN -ODT) disintegrating tablet 4 mg (4 mg Oral Given 09/18/24 0925)    FINAL CLINICAL IMPRESSION(S) / ED DIAGNOSES   Final diagnoses:  Nausea vomiting and diarrhea     Rx / DC Orders   ED Discharge Orders          Ordered    Ambulatory Referral to Primary Care (Establish Care)        09/18/24 1010    famotidine  (PEPCID ) 20 MG tablet  Daily        09/18/24 1010    ondansetron  (ZOFRAN -ODT) 4 MG disintegrating tablet  Every 8 hours PRN        09/18/24 1010             Note:  This document was prepared using Dragon voice recognition software and may include unintentional dictation errors.   Suzanne Kirsch, MD 09/18/24 1012

## 2024-09-19 ENCOUNTER — Emergency Department
Admission: EM | Admit: 2024-09-19 | Discharge: 2024-09-19 | Disposition: A | Discharge status: Home / Self Care | Payer: Self-pay | Attending: Emergency Medicine | Admitting: Emergency Medicine

## 2024-09-19 ENCOUNTER — Telehealth: Payer: Self-pay | Admitting: Physician Assistant

## 2024-09-19 ENCOUNTER — Other Ambulatory Visit: Payer: Self-pay

## 2024-09-19 DIAGNOSIS — D72829 Elevated white blood cell count, unspecified: Secondary | ICD-10-CM | POA: Insufficient documentation

## 2024-09-19 DIAGNOSIS — R197 Diarrhea, unspecified: Secondary | ICD-10-CM | POA: Insufficient documentation

## 2024-09-19 DIAGNOSIS — R112 Nausea with vomiting, unspecified: Secondary | ICD-10-CM | POA: Insufficient documentation

## 2024-09-19 DIAGNOSIS — R1110 Vomiting, unspecified: Secondary | ICD-10-CM

## 2024-09-19 DIAGNOSIS — E876 Hypokalemia: Secondary | ICD-10-CM | POA: Insufficient documentation

## 2024-09-19 LAB — COMPREHENSIVE METABOLIC PANEL WITH GFR
ALT: 17 U/L (ref 0–44)
AST: 18 U/L (ref 15–41)
Albumin: 4.6 g/dL (ref 3.5–5.0)
Alkaline Phosphatase: 60 U/L (ref 38–126)
Anion gap: 9 (ref 5–15)
BUN: 11 mg/dL (ref 6–20)
CO2: 27 mmol/L (ref 22–32)
Calcium: 9.4 mg/dL (ref 8.9–10.3)
Chloride: 103 mmol/L (ref 98–111)
Creatinine, Ser: 1.06 mg/dL (ref 0.61–1.24)
GFR, Estimated: >60 mL/min (ref >60)
Glucose, Bld: 96 mg/dL (ref 70–99)
Potassium: 3.1 mmol/L — ABNORMAL LOW (ref 3.5–5.1)
Sodium: 139 mmol/L (ref 135–145)
Total Bilirubin: 0.8 mg/dL (ref 0.0–1.2)
Total Protein: 8.2 g/dL — ABNORMAL HIGH (ref 6.5–8.1)

## 2024-09-19 LAB — CBC WITH DIFFERENTIAL/PLATELET
Abs Immature Granulocytes: 0.03 K/uL (ref 0.00–0.07)
Basophils Absolute: 0.1 K/uL (ref 0.0–0.1)
Basophils Relative: 1 %
Eosinophils Absolute: 0.1 K/uL (ref 0.0–0.5)
Eosinophils Relative: 1 %
HCT: 42.3 % (ref 39.0–52.0)
Hemoglobin: 14.4 g/dL (ref 13.0–17.0)
Immature Granulocytes: 0 %
Lymphocytes Relative: 23 %
Lymphs Abs: 2.5 K/uL (ref 0.7–4.0)
MCH: 30.3 pg (ref 26.0–34.0)
MCHC: 34 g/dL (ref 30.0–36.0)
MCV: 89.1 fL (ref 80.0–100.0)
Monocytes Absolute: 0.7 K/uL (ref 0.1–1.0)
Monocytes Relative: 6 %
Neutro Abs: 7.4 K/uL (ref 1.7–7.7)
Neutrophils Relative %: 69 %
Platelets: 211 K/uL (ref 150–400)
RBC: 4.75 MIL/uL (ref 4.22–5.81)
RDW: 12.1 % (ref 11.5–15.5)
WBC: 10.7 K/uL — ABNORMAL HIGH (ref 4.0–10.5)
nRBC: 0 % (ref 0.0–0.2)

## 2024-09-19 LAB — LIPASE, BLOOD: Lipase: 24 U/L (ref 11–51)

## 2024-09-19 MED ORDER — POTASSIUM CHLORIDE CRYS ER 20 MEQ PO TBCR
40.0000 meq | EXTENDED_RELEASE_TABLET | Freq: Once | ORAL | Status: AC
  Administered 2024-09-19: 40 meq via ORAL
  Filled 2024-09-19: qty 2

## 2024-09-19 MED ORDER — PROMETHAZINE HCL 25 MG RE SUPP
25.0000 mg | Freq: Three times a day (TID) | RECTAL | 0 refills | Status: AC | PRN
Start: 2024-09-19 — End: ?

## 2024-09-19 NOTE — Patient Instructions (Signed)
 Joe [name], thank you for joining Joe [name], PA-C for today's virtual visit.  While this provider is not your primary care provider (PCP), if your PCP is located in our provider database this encounter information will be shared with them immediately following your visit.   A [facility] MyChart account gives you access to today's visit and all your visits, tests, and labs performed at [facility]  click here if you don't have a [facility] MyChart account or go to [email or web address]  Consent: (Patient) Joe [name] provided verbal consent for this virtual visit at the beginning of the encounter.  Current Medications:  Current Outpatient Medications:    promethazine  (PHENERGAN ) 25 MG suppository, Place 1 suppository (25 mg total) rectally every 8 (eight) hours as needed for nausea or vomiting., Disp: 12 each, Rfl: 0   dicyclomine  (BENTYL ) 20 MG tablet, Take 1 tablet (20 mg total) by mouth every 8 (eight) hours as needed., Disp: 15 tablet, Rfl: 0   famotidine  (PEPCID ) 20 MG tablet, Take 1 tablet (20 mg total) by mouth daily for 14 days., Disp: 14 tablet, Rfl: 0   hydrocortisone  (ANUSOL -HC) 25 MG suppository, Place 1 suppository (25 mg total) rectally every 12 (twelve) hours., Disp: 12 suppository, Rfl: 1   ondansetron  (ZOFRAN -ODT) 4 MG disintegrating tablet, Take 1 tablet (4 mg total) by mouth every 8 (eight) hours as needed for nausea or vomiting., Disp: 30 tablet, Rfl: 0   Medications ordered in this encounter:  Meds ordered this encounter  Medications   promethazine  (PHENERGAN ) 25 MG suppository    Sig: Place 1 suppository (25 mg total) rectally every 8 (eight) hours as needed for nausea or vomiting.    Dispense:  12 each    Refill:  0    Supervising Provider:   BLAISE ALEENE KIDD [phone]     *If you need refills on other medications prior to your next appointment, please contact your pharmacy*  Follow-Up: Call back or seek an in-person  evaluation if the symptoms worsen or if the condition fails to improve as anticipated.  [facility] [phone]  Other Instructions  Cannabinoid Hyperemesis Syndrome Cannabinoid hyperemesis syndrome (CHS) is a condition that causes repeated nausea, vomiting, and abdominal pain after long-term use of marijuana (cannabis). People with CHS typically use marijuana 3-5 times a day for many years before they have symptoms, although it is possible to develop CHS with far less daily use. Symptoms of CHS may be mild at first but can get worse and more frequent. In some cases, CHS may cause severe daily vomiting, which can lead to weight loss and dehydration. What are the causes? The exact cause of CHS is not known. Long-term use of marijuana may overstimulate certain proteins in the brain and digestive tract that react with chemicals in marijuana (cannabinoid receptors). This overstimulation may cause CHS. What are the signs or symptoms? Symptoms of CHS are often mild during the first few episodes, but they can get worse over time. Symptoms may include: Frequent nausea, especially early in the morning. Vomiting. This can become severe. Abdominal pain. Feeling very tired (lethargic). Headaches. CHS may go away and come back many times (recur). People may not have symptoms or may otherwise be healthy in between [facility] episodes. Taking hot showers can relieve the symptoms of CHS, so feeling the need to take several hot showers throughout the day can be a sign of this condition. How is this diagnosed? CHS may be diagnosed based on: Your  symptoms and medical history, including any drug use. A physical exam. You may have tests done to rule out other problems that could cause your symptoms. These tests may include: Blood tests. Urine tests. Imaging tests, such as an X-ray or a CT scan. How is this treated? Treatment for this condition involves stopping marijuana use. Treatment may  include: A drug rehab program, if you have trouble stopping marijuana use. Medicines for nausea. These may be given at the hospital through an IV inserted into one of your veins, or they may be medicines that you take by mouth (orally). Certain creams that contain a substance called capsaicin. These may improve symptoms when applied to the abdomen. Hot showers to help relieve symptoms. In severe cases, you may need treatment at a hospital. You may be given IV fluids to prevent or treat dehydration as well as medicines to treat nausea, vomiting, and pain. Follow these instructions at home: During an episode of CHS  Stay in bed and rest in a dark, quiet room. Take anti-nausea medicine as told by your health care provider. Try taking hot showers to relieve your symptoms. After an episode of CHS Drink small amounts of clear fluids. Slowly add more if you can keep the fluids down without vomiting. Once you are able to eat without vomiting, eat soft foods in small amounts every 3-4 hours. General instructions Do not use any products that contain marijuana.If you need help quitting, ask your health care provider for resources and treatment options. Drink enough fluid to keep your urine pale yellow. Avoid drinking fluids that have a lot of sugar or caffeine, such as coffee and soda. Take and apply over-the-counter and prescription medicines only as told by your health care provider. Ask your health care provider before starting any new medicines or treatments. Keep all follow-up visits. This includes any recommended programs for substance use disorders. Contact a health care provider if: Your symptoms get worse. You cannot drink fluids without vomiting or severe pain. You have pain and trouble swallowing after an episode. Get help right away if: You cannot stop vomiting. You have blood in your vomit or your vomit looks like coffee grounds. You have severe abdominal pain. You have stools that are  bloody or black, or stools that look like tar. You have symptoms of dehydration, such as: Sunken eyes. Inability to make tears. Cracked lips or dry mouth. Decreased urine production. Weakness. Sleepiness. Dizziness, light-headedness, or fainting. These symptoms may be an emergency. Get help right away. Call 911. Do not wait to see if the symptoms will go away. Do not drive yourself to the hospital. Summary Cannabinoid hyperemesis syndrome (CHS) is a condition that causes repeated nausea, vomiting, and abdominal pain after long-term use of marijuana. Treatment for this condition involves stopping marijuana use. Hot showers and capsaicin creams may also help relieve symptoms. Your health care provider may prescribe medicines to help with nausea. Ask your health care provider before starting any medicines or other treatments. This information is not intended to replace advice given to you by your health care provider. Make sure you discuss any questions you have with your health care provider. Document Revised: 04/13/2022 Document Reviewed: 04/13/2022 Elsevier Patient Education  2024 Elsevier Inc.   If you have been instructed to have an in-person evaluation today at a local Urgent Care facility, please use the link below. It will take you to a list of all of our available [facility], including address, phone number and hours  of operation. Please do not delay care.  [facility]  If you or a family member do not have a primary care provider, use the link below to schedule a visit and establish care. When you choose a [facility] primary care physician or advanced practice provider, you gain a long-term partner in health. Find a Primary Care Provider  Learn more about [facility]'s in-office and virtual care options: [facility] - Get Care Now

## 2024-09-19 NOTE — Progress Notes (Signed)
 Virtual Visit Consent   Joe [name], you are scheduled for a virtual visit with a [facility] provider today. Just as with appointments in the office, your consent must be obtained to participate. Your consent will be active for this visit and any virtual visit you may have with one of our providers in the next 365 days. If you have a MyChart account, a copy of this consent can be sent to you electronically.  As this is a virtual visit, video technology does not allow for your provider to perform a traditional examination. This may limit your provider's ability to fully assess your condition. If your provider identifies any concerns that need to be evaluated in person or the need to arrange testing (such as labs, EKG, etc.), we will make arrangements to do so. Although advances in technology are sophisticated, we cannot ensure that it will always work on either your end or our end. If the connection with a video visit is poor, the visit may have to be switched to a telephone visit. With either a video or telephone visit, we are not always able to ensure that we have a secure connection.  By engaging in this virtual visit, you consent to the provision of healthcare and authorize for your insurance to be billed (if applicable) for the services provided during this visit. Depending on your insurance coverage, you may receive a charge related to this service.  I need to obtain your verbal consent now. Are you willing to proceed with your visit today? Joe [name] has provided verbal consent on 09/19/2024 for a virtual visit (video or telephone). Joe [name], PA-C  Date: 09/19/2024 11:55 AM   Virtual Visit via Video Note   I, Joe [name], connected with  Joe [name]  ([id number], 1991/08/08) on 09/19/24 at 10:15 AM EDT by a video-enabled telemedicine application and verified that I am speaking with the correct person using two identifiers.  Location: Patient: Virtual Visit  Location Patient: Home Provider: Virtual Visit Location Provider: Home Office   I discussed the limitations of evaluation and management by telemedicine and the availability of in person appointments. The patient expressed understanding and agreed to proceed.    History of Present Illness: Joe [name] is a 33 y.o. who identifies as a male who was assigned male at birth, and is being seen today for nausea, vomiting, diarrhea.  HPI: Emesis  This is a recurrent problem. The current episode started 1 to 4 weeks ago (Seen in ER on 09/06/24 and 09/18/24 for similar symptoms. Renal CT on 09/06/24 was negative. Has been prescribed Zofran  ODT 4mg  twice, Dicyclomine  on 09/06/24, and Pepcid  on 09/18/24 without relief). The problem occurs 5 to 10 times per day. The problem has been unchanged. The emesis has an appearance of stomach contents. There has been no fever. Associated symptoms include abdominal pain, diarrhea and myalgias. Pertinent negatives include no chills, coughing, dizziness, fever or sweats. Risk factors: unknown trigger, felt possibly due to synthetic THC he uses for pain vs cyclic vomiting syndrome; however he voices having smoked or used THC products since he was 13 and has used synthetic THC for 3-4 years. He has tried increased fluids and bed rest (Pepcid , Zofran  ODT, Dicyclomine , Pepto) for the symptoms. The treatment provided no relief.     Problems:  Patient Active Problem List   Diagnosis Date Noted   Paroxysmal atrial fibrillation (HCC)     Allergies: No Known Allergies Medications:  Current Outpatient Medications:  promethazine  (PHENERGAN ) 25 MG suppository, Place 1 suppository (25 mg total) rectally every 8 (eight) hours as needed for nausea or vomiting., Disp: 12 each, Rfl: 0   dicyclomine  (BENTYL ) 20 MG tablet, Take 1 tablet (20 mg total) by mouth every 8 (eight) hours as needed., Disp: 15 tablet, Rfl: 0   famotidine  (PEPCID ) 20 MG tablet, Take 1 tablet (20 mg total)  by mouth daily for 14 days., Disp: 14 tablet, Rfl: 0   hydrocortisone  (ANUSOL -HC) 25 MG suppository, Place 1 suppository (25 mg total) rectally every 12 (twelve) hours., Disp: 12 suppository, Rfl: 1   ondansetron  (ZOFRAN -ODT) 4 MG disintegrating tablet, Take 1 tablet (4 mg total) by mouth every 8 (eight) hours as needed for nausea or vomiting., Disp: 30 tablet, Rfl: 0  Observations/Objective: Patient is well-developed, well-nourished in no acute distress.  Resting comfortably at home.  Head is normocephalic, atraumatic.  No labored breathing.  Speech is clear and coherent with logical content.  Patient is alert and oriented at baseline.    Assessment and Plan: 1. Hyperemesis (Primary) - promethazine  (PHENERGAN ) 25 MG suppository; Place 1 suppository (25 mg total) rectally every 8 (eight) hours as needed for nausea or vomiting.  Dispense: 12 each; Refill: 0  - Unknown trigger but possible hyperemesis from synthetic THC vs new onset cyclic vomiting - Failed Zofran  ODT out patient - Will add Promethazine  Suppository above, drowsiness precautions discussed - Push fluids, electrolyte beverages - Rest - Work note provided - Strict ER precautions advised if not improving in next 6-8 hours - Seek in person evaluation if worsening or fails to improve  Follow Up Instructions: I discussed the assessment and treatment plan with the patient. The patient was provided an opportunity to ask questions and all were answered. The patient agreed with the plan and demonstrated an understanding of the instructions.  A copy of instructions were sent to the patient via MyChart unless otherwise noted below.    The patient was advised to call back or seek an in-person evaluation if the symptoms worsen or if the condition fails to improve as anticipated.    Joe [name], PA-C

## 2024-09-19 NOTE — Discharge Instructions (Addendum)
 Your blood work was normal today aside from your potassium being a little bit low.  You were given a potassium supplement to correct this today.  Please take the nausea medications as prescribed.  Return to the emergency department with any worsening symptoms.  Please specifically watch for bright red blood in your stool or dark tarry stools.  If your diarrhea persists please follow-up with your primary care provider to provide a stool sample at which point antibiotics may be needed.

## 2024-09-19 NOTE — ED Triage Notes (Signed)
 Pt to ED for continued nausea and my stool looks like snot. Was sent in ED yesterday and recommended to stop marijuana

## 2024-09-19 NOTE — ED Provider Notes (Signed)
 [facility] Provider Note    Event Date/Time   First MD Initiated Contact with Patient 09/19/24 1637     (approximate)   History   abnormal stool   HPI  Joe [name] is a 33 y.o. male who presents for evaluation of nausea, vomiting, diarrhea and abnormal stools.  Patient states he has had symptoms for the past few days.  He had a bowel movement today where his stool looked like snot so he came to the emergency department.  Patient was seen in the ED yesterday for his complaints of nausea, vomiting and diarrhea.  Workup was reassuring and he was given nausea medication as well as Pepcid  for treatment of gastritis/PUD versus cyclical vomiting syndrome.  Patient was seen by his PCP via video visit this morning and was prescribed Phenergan  suppositories but has not been to the pharmacy to pick that up today.  Patient had 1 episode of vomiting this morning.      Physical Exam   Triage Vital Signs: ED Triage Vitals [09/19/24 1419]  Encounter Vitals Group     BP (!) 147/91     Girls Systolic BP Percentile      Girls Diastolic BP Percentile      Boys Systolic BP Percentile      Boys Diastolic BP Percentile      Pulse Rate 84     Resp 16     Temp 98.7 F (37.1 C)     Temp src      SpO2 97 %     Weight 136 lb 11 oz (62 kg)     Height 5' 7 (1.702 m)     Head Circumference      Peak Flow      Pain Score 4     Pain Loc      Pain Education      Exclude from Growth Chart     Most recent vital signs: Vitals:   09/19/24 1419 09/19/24 1839  BP: (!) 147/91 (!) 138/92  Pulse: 84 82  Resp: 16 17  Temp: 98.7 F (37.1 C) 98.3 F (36.8 C)  SpO2: 97% 100%   General: Awake, no distress.  CV:  Good peripheral perfusion. RRR. Resp:  Normal effort. CTAB. Abd:  No distention. Soft, no TTP.  Other:     ED Results / Procedures / Treatments   Labs (all labs ordered are listed, but only abnormal results are displayed) Labs Reviewed  COMPREHENSIVE  METABOLIC PANEL WITH GFR - Abnormal; Notable for the following components:      Result Value   Potassium 3.1 (*)    Total Protein 8.2 (*)    All other components within normal limits  CBC WITH DIFFERENTIAL/PLATELET - Abnormal; Notable for the following components:   WBC 10.7 (*)    All other components within normal limits  C DIFFICILE QUICK SCREEN W PCR REFLEX    GASTROINTESTINAL PANEL BY PCR, STOOL (REPLACES STOOL CULTURE)  LIPASE, BLOOD     PROCEDURES:  Critical Care performed: No  Procedures   MEDICATIONS ORDERED IN ED: Medications  potassium chloride  SA (KLOR-CON  M) CR tablet 40 mEq (has no administration in time range)     IMPRESSION / MDM / ASSESSMENT AND PLAN / ED COURSE  I reviewed the triage vital signs and the nursing notes.  33 year old male presents for evaluation of nausea, vomiting, diarrhea and abnormal stool.  Blood pressure is a little bit elevated otherwise vital signs are stable.  Differential diagnosis includes, but is not limited to, gastroenteritis, cyclical vomiting syndrome, electrolyte abnormality, dehydration.  Patient's presentation is most consistent with acute complicated illness / injury requiring diagnostic workup.  Suspect that the mucus in his stool is secondary to gastro enteritis and all of the inflammation from his recent vomiting and diarrhea.  Low suspicion for inflammatory bowel disease or IBS as patient has only had 1 episode of this.  Patient does not appear clinically dehydrated.  Will obtain basic labs to assess patient's hydration status and rule out electrolyte abnormalities and check for leukocytosis.  Will also attempt to get a stool sample while patient is here in the emergency department.  Will p.o. challenge as well.  If patient's white blood cell count is significantly elevated will consider CT scan to evaluate for colitis and consider empiric treatment at that point.   Clinical Course as of  09/19/24 1932  Tue Sep 19, 2024  1738 CBC with Differential(!) Very mild leukocytosis, otherwise normal. [LD]  1801 Comprehensive metabolic panel(!) Hypokalemia, otherwise normal, will give patient a potassium supplement. [LD]  1820 Did consider CT scan but feel this would be low yield given patient is not tender to palpation, vital signs are stable and lab work is reassuring.  Will p.o. challenge and if patient is able to tolerate liquids and some crackers we will plan to discharge home. [LD]  1928 Patient is tolerating liquids and solids.  States he is feeling good and ready to go home.  Advised him to pick up the previously prescribed Phenergan .  Discussed how to take both this medication and the Zofran .  Advised him to keep an eye on his stool quality and return to the emergency department if he notices any frank blood or dark tarry stools.  Patient voiced understanding, all questions were answered and he was stable at discharge. [LD]  1929 Do not think patient was able to provide enough of a sample to run a stool panel.  Will not plan to treat empirically for this.  If symptoms continue patient can provide stool sample to primary care provider who may prescribe antibiotics at that point.  Feel that the risks of empiric treatment outweigh possible benefits due to the potential GI side effects of the antibiotic. [LD]    Clinical Course User Index [LD] Cleaster Tinnie LABOR, PA-C     FINAL CLINICAL IMPRESSION(S) / ED DIAGNOSES   Final diagnoses:  Nausea vomiting and diarrhea     Rx / DC Orders   ED Discharge Orders     None        Note:  This document was prepared using Dragon voice recognition software and may include unintentional dictation errors.   Cleaster Tinnie LABOR, PA-C 09/19/24 1932    Dorothyann Drivers, MD 09/19/24 2111

## 2024-10-27 ENCOUNTER — Other Ambulatory Visit: Payer: Self-pay

## 2024-10-27 ENCOUNTER — Emergency Department
Admission: EM | Admit: 2024-10-27 | Discharge: 2024-10-27 | Disposition: A | Discharge status: Home / Self Care | Payer: Self-pay | Attending: Emergency Medicine | Admitting: Emergency Medicine

## 2024-10-27 ENCOUNTER — Encounter: Payer: Self-pay | Admitting: Emergency Medicine

## 2024-10-27 DIAGNOSIS — K2900 Acute gastritis without bleeding: Secondary | ICD-10-CM | POA: Insufficient documentation

## 2024-10-27 DIAGNOSIS — E876 Hypokalemia: Secondary | ICD-10-CM | POA: Insufficient documentation

## 2024-10-27 DIAGNOSIS — D72829 Elevated white blood cell count, unspecified: Secondary | ICD-10-CM | POA: Insufficient documentation

## 2024-10-27 LAB — CBC
HCT: 46.6 % (ref 39.0–52.0)
Hemoglobin: 16.1 g/dL (ref 13.0–17.0)
MCH: 30 pg (ref 26.0–34.0)
MCHC: 34.5 g/dL (ref 30.0–36.0)
MCV: 86.8 fL (ref 80.0–100.0)
Platelets: 239 K/uL (ref 150–400)
RBC: 5.37 MIL/uL (ref 4.22–5.81)
RDW: 12.3 % (ref 11.5–15.5)
WBC: 12.3 K/uL — ABNORMAL HIGH (ref 4.0–10.5)
nRBC: 0 % (ref 0.0–0.2)

## 2024-10-27 LAB — COMPREHENSIVE METABOLIC PANEL WITH GFR
ALT: 17 U/L (ref 0–44)
AST: 22 U/L (ref 15–41)
Albumin: 4.7 g/dL (ref 3.5–5.0)
Alkaline Phosphatase: 61 U/L (ref 38–126)
Anion gap: 12 (ref 5–15)
BUN: 15 mg/dL (ref 6–20)
CO2: 24 mmol/L (ref 22–32)
Calcium: 9.7 mg/dL (ref 8.9–10.3)
Chloride: 101 mmol/L (ref 98–111)
Creatinine, Ser: 1.03 mg/dL (ref 0.61–1.24)
GFR, Estimated: >60 mL/min (ref >60)
Glucose, Bld: 139 mg/dL — ABNORMAL HIGH (ref 70–99)
Potassium: 3.4 mmol/L — ABNORMAL LOW (ref 3.5–5.1)
Sodium: 137 mmol/L (ref 135–145)
Total Bilirubin: 0.9 mg/dL (ref 0.0–1.2)
Total Protein: 8.9 g/dL — ABNORMAL HIGH (ref 6.5–8.1)

## 2024-10-27 LAB — LIPASE, BLOOD: Lipase: 47 U/L (ref 11–51)

## 2024-10-27 MED ORDER — ONDANSETRON 4 MG PO TBDP: 4.0000 mg | ORAL_TABLET | Freq: Three times a day (TID) | ORAL | 0 refills | Status: AC | PRN

## 2024-10-27 NOTE — ED Triage Notes (Signed)
 Patient to ED via POV for vomiting. Ongoing since yesterday around 10am. NAD noted. States worsen when laying down.

## 2024-10-27 NOTE — ED Provider Notes (Signed)
 [facility] Provider Note    Event Date/Time   First MD Initiated Contact with Patient 10/27/24 1339     (approximate)   History   Emesis   HPI  Joe [name] is a 33 y.o. male who presents with nausea vomiting which started overnight.  He denies diarrhea at this time.  He reports he took leftover ondansetron  which has helped significantly and is feeling improved now.  No abdominal pain.  No fevers.  Is having some mild bodyaches     Physical Exam   Triage Vital Signs: ED Triage Vitals  Encounter Vitals Group     BP 10/27/24 1235 (!) 148/99     Girls Systolic BP Percentile --      Girls Diastolic BP Percentile --      Boys Systolic BP Percentile --      Boys Diastolic BP Percentile --      Pulse Rate 10/27/24 1235 86     Resp 10/27/24 1235 17     Temp 10/27/24 1235 97.6 F (36.4 C)     Temp Source 10/27/24 1235 Oral     SpO2 10/27/24 1235 100 %     Weight 10/27/24 1234 63.5 kg (140 lb)     Height 10/27/24 1234 1.676 m (5' 6)     Head Circumference --      Peak Flow --      Pain Score 10/27/24 1234 0     Pain Loc --      Pain Education --      Exclude from Growth Chart --     Most recent vital signs: Vitals:   10/27/24 1235 10/27/24 1355  BP: (!) 148/99 (!) 144/99  Pulse: 86 84  Resp: 17 18  Temp: 97.6 F (36.4 C)   SpO2: 100% 100%     General: Awake, no distress.  CV:  Good peripheral perfusion.  Resp:  Normal effort.  Abd:  No distention.  Soft, nontender, reassuring exam Other:     ED Results / Procedures / Treatments   Labs (all labs ordered are listed, but only abnormal results are displayed) Labs Reviewed  COMPREHENSIVE METABOLIC PANEL WITH GFR - Abnormal; Notable for the following components:      Result Value   Potassium 3.4 (*)    Glucose, Bld 139 (*)    Total Protein 8.9 (*)    All other components within normal limits  CBC - Abnormal; Notable for the following components:   WBC 12.3 (*)    All other  components within normal limits  LIPASE, BLOOD  URINALYSIS, ROUTINE W REFLEX MICROSCOPIC     EKG     RADIOLOGY     PROCEDURES:  Critical Care performed:   Procedures   MEDICATIONS ORDERED IN ED: Medications - No data to display   IMPRESSION / MDM / ASSESSMENT AND PLAN / ED COURSE  I reviewed the triage vital signs and the nursing notes. Patient's presentation is most consistent with acute illness / injury with system symptoms.   Patient presents with nausea vomiting without abdominal pain, suspicious for viral gastritis/gastroenteritis versus foodborne illness  Lab work is overall quite reassuring, mild elevation of white blood cell count noted which is nonspecific.  Given improvement with Zofran , will refill his prescription, appropriate for outpatient management, work note provided, return precautions discussed       FINAL CLINICAL IMPRESSION(S) / ED DIAGNOSES   Final diagnoses:  Acute gastritis without hemorrhage, unspecified gastritis type  Rx / DC Orders   ED Discharge Orders          Ordered    ondansetron  (ZOFRAN -ODT) 4 MG disintegrating tablet  Every 8 hours PRN        10/27/24 1350             Note:  This document was prepared using Dragon voice recognition software and may include unintentional dictation errors.   Arlander Charleston, MD 10/27/24 [phone]

## 2024-10-31 ENCOUNTER — Emergency Department
Admission: EM | Admit: 2024-10-31 | Discharge: 2024-10-31 | Disposition: A | Discharge status: Home / Self Care | Payer: Self-pay | Attending: Emergency Medicine | Admitting: Emergency Medicine

## 2024-10-31 ENCOUNTER — Other Ambulatory Visit: Payer: Self-pay

## 2024-10-31 DIAGNOSIS — R1013 Epigastric pain: Secondary | ICD-10-CM | POA: Insufficient documentation

## 2024-10-31 DIAGNOSIS — K2970 Gastritis, unspecified, without bleeding: Secondary | ICD-10-CM

## 2024-10-31 DIAGNOSIS — R197 Diarrhea, unspecified: Secondary | ICD-10-CM | POA: Insufficient documentation

## 2024-10-31 DIAGNOSIS — R112 Nausea with vomiting, unspecified: Secondary | ICD-10-CM | POA: Insufficient documentation

## 2024-10-31 LAB — COMPREHENSIVE METABOLIC PANEL WITH GFR
ALT: 16 U/L (ref 0–44)
AST: 16 U/L (ref 15–41)
Albumin: 5 g/dL (ref 3.5–5.0)
Alkaline Phosphatase: 65 U/L (ref 38–126)
Anion gap: 15 (ref 5–15)
BUN: 17 mg/dL (ref 6–20)
CO2: 27 mmol/L (ref 22–32)
Calcium: 9.8 mg/dL (ref 8.9–10.3)
Chloride: 97 mmol/L — ABNORMAL LOW (ref 98–111)
Creatinine, Ser: 1.11 mg/dL (ref 0.61–1.24)
GFR, Estimated: >60 mL/min (ref >60)
Glucose, Bld: 121 mg/dL — ABNORMAL HIGH (ref 70–99)
Potassium: 4 mmol/L (ref 3.5–5.1)
Sodium: 139 mmol/L (ref 135–145)
Total Bilirubin: 1.2 mg/dL (ref 0.0–1.2)
Total Protein: 9.5 g/dL — ABNORMAL HIGH (ref 6.5–8.1)

## 2024-10-31 LAB — URINALYSIS, ROUTINE W REFLEX MICROSCOPIC
Bilirubin Urine: NEGATIVE
Glucose, UA: NEGATIVE mg/dL
Hgb urine dipstick: NEGATIVE
Ketones, ur: 20 mg/dL — AB
Leukocytes,Ua: NEGATIVE
Nitrite: NEGATIVE
Protein, ur: NEGATIVE mg/dL
Specific Gravity, Urine: 1.033 — ABNORMAL HIGH (ref 1.005–1.030)
pH: 6 (ref 5.0–8.0)

## 2024-10-31 LAB — CBC
HCT: 47.2 % (ref 39.0–52.0)
Hemoglobin: 16.4 g/dL (ref 13.0–17.0)
MCH: 29.9 pg (ref 26.0–34.0)
MCHC: 34.7 g/dL (ref 30.0–36.0)
MCV: 86.1 fL (ref 80.0–100.0)
Platelets: 221 K/uL (ref 150–400)
RBC: 5.48 MIL/uL (ref 4.22–5.81)
RDW: 12 % (ref 11.5–15.5)
WBC: 9.1 K/uL (ref 4.0–10.5)
nRBC: 0 % (ref 0.0–0.2)

## 2024-10-31 LAB — RESP PANEL BY RT-PCR (RSV, FLU A&B, COVID)  RVPGX2
Influenza A by PCR: NEGATIVE
Influenza B by PCR: NEGATIVE
Resp Syncytial Virus by PCR: NEGATIVE
SARS Coronavirus 2 by RT PCR: NEGATIVE

## 2024-10-31 LAB — LIPASE, BLOOD: Lipase: 32 U/L (ref 11–51)

## 2024-10-31 MED ORDER — METOCLOPRAMIDE HCL 10 MG PO TABS: 10.0000 mg | ORAL_TABLET | Freq: Three times a day (TID) | ORAL | 0 refills | Status: AC | PRN

## 2024-10-31 MED ORDER — METOCLOPRAMIDE HCL 5 MG/ML IJ SOLN
10.0000 mg | Freq: Once | INTRAMUSCULAR | Status: AC
  Administered 2024-10-31: 10 mg via INTRAVENOUS
  Filled 2024-10-31: qty 2

## 2024-10-31 MED ORDER — PANTOPRAZOLE SODIUM 40 MG PO TBEC: 40.0000 mg | DELAYED_RELEASE_TABLET | Freq: Every day | ORAL | 1 refills | Status: AC

## 2024-10-31 MED ORDER — SUCRALFATE 1 G PO TABS: 1.0000 g | ORAL_TABLET | Freq: Four times a day (QID) | ORAL | 0 refills | Status: AC

## 2024-10-31 MED ORDER — SODIUM CHLORIDE 0.9 % IV BOLUS
1000.0000 mL | Freq: Once | INTRAVENOUS | Status: AC
Start: 2024-10-31 — End: 2024-10-31
  Administered 2024-10-31: 1000 mL via INTRAVENOUS

## 2024-10-31 NOTE — ED Triage Notes (Signed)
 Pt seen here about 4 days ago and diagnosed with influenza. Pt states he is having chills, feels hot, has N/V/D, and generalized body aches and is not feeling any better. About 3 episodes vomiting/day and about 6 episodes diarrhea since last night. Pt is ambulatory w unlabored respirations. States had abdominal pain last night but not now.

## 2024-10-31 NOTE — ED Provider Notes (Signed)
 [facility] Provider Note    Event Date/Time   First MD Initiated Contact with Patient 10/31/24 0915     (approximate)  History   Chief Complaint: Chills, Diarrhea, and Emesis  HPI  Joe [name] is a 33 y.o. male with no significant past medical history presents emergency department for nausea vomiting diarrhea.  According to the patient over the last week or so he has been experiencing intermittent nausea vomiting diarrhea and occasional epigastric pains.  Patient was seen here 4 days ago had a negative workup and was discharged with a nausea medication and told he likely had the flu.  Patient states they did not do any further testing such as a respiratory swab, he continues to have intermittent nausea and vomiting feels like his symptoms are worse so he came back to the emergency department today.  Patient does state over the last 2 months he has had multiple visits for similar nausea and vomiting.  Patient does use marijuana products but has not noted any clear association with the use.  Patient denies any fever.  No urinary symptoms.  Physical Exam   Triage Vital Signs: ED Triage Vitals  Encounter Vitals Group     BP 10/31/24 0854 (!) 137/109     Girls Systolic BP Percentile --      Girls Diastolic BP Percentile --      Boys Systolic BP Percentile --      Boys Diastolic BP Percentile --      Pulse Rate 10/31/24 0854 77     Resp 10/31/24 0854 20     Temp 10/31/24 0854 97.9 F (36.6 C)     Temp Source 10/31/24 0854 Oral     SpO2 10/31/24 0854 100 %     Weight 10/31/24 0856 135 lb (61.2 kg)     Height 10/31/24 0856 5' 6 (1.676 m)     Head Circumference --      Peak Flow --      Pain Score 10/31/24 0855 6     Pain Loc --      Pain Education --      Exclude from Growth Chart --     Most recent vital signs: Vitals:   10/31/24 0854  BP: (!) 137/109  Pulse: 77  Resp: 20  Temp: 97.9 F (36.6 C)  SpO2: 100%    General: Awake, no  distress.  CV:  Good peripheral perfusion.  Regular rate and rhythm  Resp:  Normal effort.  Equal breath sounds bilaterally.  Abd:  No distention.  Soft, nontender.  No rebound or guarding.  ED Results / Procedures / Treatments   MEDICATIONS ORDERED IN ED: Medications - No data to display   IMPRESSION / MDM / ASSESSMENT AND PLAN / ED COURSE  I reviewed the triage vital signs and the nursing notes.  Patient's presentation is most consistent with acute presentation with potential threat to life or bodily function.  Patient presents to the emergency department for nausea vomiting and diarrhea symptoms intermittent over the past 1 week, but more chronically over the last 2 months.  Patient does have occasional gastric reflux and takes Pepcid  at night.  Patient denies any fever.  No urinary symptoms.  Given the recurrent nature of the symptoms we will check labs today including a CBC chemistry lipase urinalysis.  Will obtain a respiratory panel we will IV hydrate and treat nausea with Reglan.  Patient has had intermittent abdominal pain although denies any currently  and a benign abdomen on exam.  Will continue to closely monitor while awaiting further results.  Patient agreeable to plan of care.  Patient's workup is reassuring and normal CBC, reassuring chemistry with borderline elevated anion gap indicating dehydration, patient receiving IV fluids.  LFTs and lipase are normal.  Respiratory panel is negative.  Urinalysis shows small amount of ketones again indicating dehydration otherwise negative.    Patient is feeling better after IV fluids and Reglan.  Will discharge with Reglan in addition to Protonix and sucralfate.  Will have the patient follow-up with GI medicine.  Patient agreeable plan.  FINAL CLINICAL IMPRESSION(S) / ED DIAGNOSES   Nausea vomiting diarrhea    Note:  This document was prepared using Dragon voice recognition software and may include unintentional dictation errors.    Dorothyann Drivers, MD 10/31/24 1055

## 2024-10-31 NOTE — Discharge Instructions (Addendum)
 Please take your medications as prescribed please call the number provided for GI medicine to arrange a follow-up appointment as soon as possible.  Return to the emergency department for any worsening pain, fever or any other symptom personally concerning to yourself.
